# Patient Record
Sex: Female | Born: 1995 | Race: Black or African American | Hispanic: No | Marital: Single | Smoking: Never smoker
Health system: Southern US, Community
[De-identification: ages and names within clinical notes are randomized; demographics above are authoritative.]

## PROBLEM LIST (undated history)

## (undated) ENCOUNTER — Inpatient Hospital Stay (HOSPITAL_COMMUNITY): Payer: Self-pay

## (undated) DIAGNOSIS — Z789 Other specified health status: Secondary | ICD-10-CM

## (undated) DIAGNOSIS — D509 Iron deficiency anemia, unspecified: Secondary | ICD-10-CM

## (undated) HISTORY — PX: NO PAST SURGERIES: SHX2092

## (undated) HISTORY — DX: Iron deficiency anemia, unspecified: D50.9

---
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
## 2015-03-29 ENCOUNTER — Inpatient Hospital Stay (HOSPITAL_COMMUNITY)
Admission: AD | Admit: 2015-03-29 | Discharge: 2015-03-29 | Disposition: A | Discharge status: Home / Self Care | Payer: 59 | Source: Ambulatory Visit | Attending: Obstetrics & Gynecology | Admitting: Obstetrics & Gynecology

## 2015-03-29 ENCOUNTER — Encounter (HOSPITAL_COMMUNITY): Payer: Self-pay | Admitting: *Deleted

## 2015-03-29 DIAGNOSIS — Z3A01 Less than 8 weeks gestation of pregnancy: Secondary | ICD-10-CM | POA: Diagnosis not present

## 2015-03-29 DIAGNOSIS — O219 Vomiting of pregnancy, unspecified: Secondary | ICD-10-CM | POA: Diagnosis not present

## 2015-03-29 DIAGNOSIS — R42 Dizziness and giddiness: Secondary | ICD-10-CM | POA: Diagnosis not present

## 2015-03-29 DIAGNOSIS — O210 Mild hyperemesis gravidarum: Secondary | ICD-10-CM | POA: Insufficient documentation

## 2015-03-29 HISTORY — DX: Other specified health status: Z78.9

## 2015-03-29 LAB — POCT PREGNANCY, URINE: Preg Test, Ur: POSITIVE — AB

## 2015-03-29 LAB — URINALYSIS, ROUTINE W REFLEX MICROSCOPIC
BILIRUBIN URINE: NEGATIVE
Glucose, UA: NEGATIVE mg/dL
Ketones, ur: 15 mg/dL — AB
Leukocytes, UA: NEGATIVE
Nitrite: NEGATIVE
PH: 6 (ref 5.0–8.0)
Protein, ur: NEGATIVE mg/dL
Specific Gravity, Urine: 1.02 (ref 1.005–1.030)
Urobilinogen, UA: 0.2 mg/dL (ref 0.0–1.0)

## 2015-03-29 LAB — URINE MICROSCOPIC-ADD ON

## 2015-03-29 MED ORDER — PROMETHAZINE HCL 25 MG PO TABS: 25.0000 mg | ORAL_TABLET | Freq: Four times a day (QID) | ORAL | Status: DC | PRN

## 2015-03-29 MED ORDER — PROMETHAZINE HCL 25 MG PO TABS
25.0000 mg | ORAL_TABLET | Freq: Once | ORAL | Status: AC
  Administered 2015-03-29: 25 mg via ORAL
  Filled 2015-03-29: qty 1

## 2015-03-29 MED ORDER — CONCEPT OB 130-92.4-1 MG PO CAPS: 1.0000 | ORAL_CAPSULE | Freq: Every day | ORAL | Status: DC

## 2015-03-29 NOTE — MAU Note (Signed)
LMP 02/17/15. Took HPT and it was positive. Started to become dizzy yesterday. States she is nauseated and continues to vomit and is unable to hold down any food. Denies any bleeding or discharge. Made an appointment for the clinic on [address] for tomorrow.

## 2015-03-29 NOTE — Discharge Instructions (Signed)
Morning Sickness  Morning sickness is when you feel sick to your stomach (nauseous) during pregnancy. This nauseous feeling may or may not come with vomiting. It often occurs in the morning but can be a problem any time of day. Morning sickness is most common during the first trimester, but it may continue throughout pregnancy. While morning sickness is unpleasant, it is usually harmless unless you develop severe and continual vomiting (hyperemesis gravidarum). This condition requires more intense treatment.   CAUSES   The cause of morning sickness is not completely known but seems to be related to normal hormonal changes that occur in pregnancy.  RISK FACTORS  You are at greater risk if you:  · Experienced nausea or vomiting before your pregnancy.  · Had morning sickness during a previous pregnancy.  · Are pregnant with more than one baby, such as twins.  TREATMENT   Do not use any medicines (prescription, over-the-counter, or herbal) for morning sickness without first talking to your health care provider. Your health care provider may prescribe or recommend:  · Vitamin B6 supplements.  · Anti-nausea medicines.  · The herbal medicine ginger.  HOME CARE INSTRUCTIONS   · Only take over-the-counter or prescription medicines as directed by your health care provider.  · Taking multivitamins before getting pregnant can prevent or decrease the severity of morning sickness in most women.  · Eat a piece of dry toast or unsalted crackers before getting out of bed in the morning.  · Eat five or six small meals a day.  · Eat dry and bland foods (rice, baked potato). Foods high in carbohydrates are often helpful.  · Do not drink liquids with your meals. Drink liquids between meals.  · Avoid greasy, fatty, and spicy foods.  · Get someone to cook for you if the smell of any food causes nausea and vomiting.  · If you feel nauseous after taking prenatal vitamins, take the vitamins at night or with a snack.   · Snack on protein  foods (nuts, yogurt, cheese) between meals if you are hungry.  · Eat unsweetened gelatins for desserts.  · Wearing an acupressure wristband (worn for sea sickness) may be helpful.  · Acupuncture may be helpful.  · Do not smoke.  · Get a humidifier to keep the air in your house free of odors.  · Get plenty of fresh air.  SEEK MEDICAL CARE IF:   · Your home remedies are not working, and you need medicine.  · You feel dizzy or lightheaded.  · You are losing weight.  SEEK IMMEDIATE MEDICAL CARE IF:   · You have persistent and uncontrolled nausea and vomiting.  · You pass out (faint).  MAKE SURE YOU:  · Understand these instructions.  · Will watch your condition.  · Will get help right away if you are not doing well or get worse.  Document Released: 09/27/2006 Document Revised: 08/11/2013 Document Reviewed: 01/21/2013  ExitCare® Patient Information ©2015 [employer]. This information is not intended to replace advice given to you by your health care provider. Make sure you discuss any questions you have with your health care provider.    First Trimester of Pregnancy  The first trimester of pregnancy is from week 1 until the end of week 12 (months 1 through 3). A week after a sperm fertilizes an egg, the egg will implant on the wall of the uterus. This embryo will begin to develop into a baby. Genes from you and your partner are forming   the baby. The female genes determine whether the baby is a boy or a girl. At 6-8 weeks, the eyes and face are formed, and the heartbeat can be seen on ultrasound. At the end of 12 weeks, all the baby's organs are formed.   Now that you are pregnant, you will want to do everything you can to have a healthy baby. Two of the most important things are to get good prenatal care and to follow your health care provider's instructions. Prenatal care is all the medical care you receive before the baby's birth. This care will help prevent, find, and treat any problems during the pregnancy and  childbirth.  BODY CHANGES  Your body goes through many changes during pregnancy. The changes vary from woman to woman.   · You may gain or lose a couple of pounds at first.  · You may feel sick to your stomach (nauseous) and throw up (vomit). If the vomiting is uncontrollable, call your health care provider.  · You may tire easily.  · You may develop headaches that can be relieved by medicines approved by your health care provider.  · You may urinate more often. Painful urination may mean you have a bladder infection.  · You may develop heartburn as a result of your pregnancy.  · You may develop constipation because certain hormones are causing the muscles that push waste through your intestines to slow down.  · You may develop hemorrhoids or swollen, bulging veins (varicose veins).  · Your breasts may begin to grow larger and become tender. Your nipples may stick out more, and the tissue that surrounds them (areola) may become darker.  · Your gums may bleed and may be sensitive to brushing and flossing.  · Dark spots or blotches (chloasma, mask of pregnancy) may develop on your face. This will likely fade after the baby is born.  · Your menstrual periods will stop.  · You may have a loss of appetite.  · You may develop cravings for certain kinds of food.  · You may have changes in your emotions from day to day, such as being excited to be pregnant or being concerned that something may go wrong with the pregnancy and baby.  · You may have more vivid and strange dreams.  · You may have changes in your hair. These can include thickening of your hair, rapid growth, and changes in texture. Some women also have hair loss during or after pregnancy, or hair that feels dry or thin. Your hair will most likely return to normal after your baby is born.  WHAT TO EXPECT AT YOUR PRENATAL VISITS  During a routine prenatal visit:  · You will be weighed to make sure you and the baby are growing normally.  · Your blood pressure will  be taken.  · Your abdomen will be measured to track your baby's growth.  · The fetal heartbeat will be listened to starting around week 10 or 12 of your pregnancy.  · Test results from any previous visits will be discussed.  Your health care provider may ask you:  · How you are feeling.  · If you are feeling the baby move.  · If you have had any abnormal symptoms, such as leaking fluid, bleeding, severe headaches, or abdominal cramping.  · If you have any questions.  Other tests that may be performed during your first trimester include:  · Blood tests to find your blood type and to check for the presence   of any previous infections. They will also be used to check for low iron levels (anemia) and Rh antibodies. Later in the pregnancy, blood tests for diabetes will be done along with other tests if problems develop.  · Urine tests to check for infections, diabetes, or protein in the urine.  · An ultrasound to confirm the proper growth and development of the baby.  · An amniocentesis to check for possible genetic problems.  · Fetal screens for spina bifida and Down syndrome.  · You may need other tests to make sure you and the baby are doing well.  HOME CARE INSTRUCTIONS   Medicines  · Follow your health care provider's instructions regarding medicine use. Specific medicines may be either safe or unsafe to take during pregnancy.  · Take your prenatal vitamins as directed.  · If you develop constipation, try taking a stool softener if your health care provider approves.  Diet  · Eat regular, well-balanced meals. Choose a variety of foods, such as meat or vegetable-based protein, fish, milk and low-fat dairy products, vegetables, fruits, and whole grain breads and cereals. Your health care provider will help you determine the amount of weight gain that is right for you.  · Avoid raw meat and uncooked cheese. These carry germs that can cause birth defects in the baby.  · Eating four or five small meals rather than three  large meals a day may help relieve nausea and vomiting. If you start to feel nauseous, eating a few soda crackers can be helpful. Drinking liquids between meals instead of during meals also seems to help nausea and vomiting.  · If you develop constipation, eat more high-fiber foods, such as fresh vegetables or fruit and whole grains. Drink enough fluids to keep your urine clear or pale yellow.  Activity and Exercise  · Exercise only as directed by your health care provider. Exercising will help you:  ¨ Control your weight.  ¨ Stay in shape.  ¨ Be prepared for labor and delivery.  · Experiencing pain or cramping in the lower abdomen or low back is a good sign that you should stop exercising. Check with your health care provider before continuing normal exercises.  · Try to avoid standing for long periods of time. Move your legs often if you must stand in one place for a long time.  · Avoid heavy lifting.  · Wear low-heeled shoes, and practice good posture.  · You may continue to have sex unless your health care provider directs you otherwise.  Relief of Pain or Discomfort  · Wear a good support bra for breast tenderness.    · Take warm sitz baths to soothe any pain or discomfort caused by hemorrhoids. Use hemorrhoid cream if your health care provider approves.    · Rest with your legs elevated if you have leg cramps or low back pain.  · If you develop varicose veins in your legs, wear support hose. Elevate your feet for 15 minutes, 3-4 times a day. Limit salt in your diet.  Prenatal Care  · Schedule your prenatal visits by the twelfth week of pregnancy. They are usually scheduled monthly at first, then more often in the last 2 months before delivery.  · Write down your questions. Take them to your prenatal visits.  · Keep all your prenatal visits as directed by your health care provider.  Safety  · Wear your seat belt at all times when driving.  · Make a list of emergency phone numbers, including   numbers for family,  friends, the hospital, and police and fire departments.  General Tips  · Ask your health care provider for a referral to a local prenatal education class. Begin classes no later than at the beginning of month 6 of your pregnancy.  · Ask for help if you have counseling or nutritional needs during pregnancy. Your health care provider can offer advice or refer you to specialists for help with various needs.  · Do not use hot tubs, steam rooms, or saunas.  · Do not douche or use tampons or scented sanitary pads.  · Do not cross your legs for long periods of time.  · Avoid cat litter boxes and soil used by cats. These carry germs that can cause birth defects in the baby and possibly loss of the fetus by miscarriage or stillbirth.  · Avoid all smoking, herbs, alcohol, and medicines not prescribed by your health care provider. Chemicals in these affect the formation and growth of the baby.  · Schedule a dentist appointment. At home, brush your teeth with a soft toothbrush and be gentle when you floss.  SEEK MEDICAL CARE IF:   · You have dizziness.  · You have mild pelvic cramps, pelvic pressure, or nagging pain in the abdominal area.  · You have persistent nausea, vomiting, or diarrhea.  · You have a bad smelling vaginal discharge.  · You have pain with urination.  · You notice increased swelling in your face, hands, legs, or ankles.  SEEK IMMEDIATE MEDICAL CARE IF:   · You have a fever.  · You are leaking fluid from your vagina.  · You have spotting or bleeding from your vagina.  · You have severe abdominal cramping or pain.  · You have rapid weight gain or loss.  · You vomit blood or material that looks like coffee grounds.  · You are exposed to German measles and have never had them.  · You are exposed to fifth disease or chickenpox.  · You develop a severe headache.  · You have shortness of breath.  · You have any kind of trauma, such as from a fall or a car accident.  Document Released: 07/31/2001 Document Revised:  12/21/2013 Document Reviewed: 06/16/2013  ExitCare® Patient Information ©2015 [employer]. This information is not intended to replace advice given to you by your health care provider. Make sure you discuss any questions you have with your health care provider.

## 2015-03-29 NOTE — MAU Provider Note (Signed)
History     CSN: [id number]  Arrival date and time: 03/29/15 4098   First Provider Initiated Contact with Patient 03/29/15 2040      No chief complaint on file.  This is a 19 y.o. female at [redacted]w[redacted]d by LMP who presents with c/o nausea and vomiting since yesterday. States cannot keep down food or fluids.  Denies abdominal pain, bleeding or other problems. Has an appointment at [facility] tomorrow for new OB intake.   Emesis  This is a new problem. The current episode started yesterday. The problem occurs 2 to 4 times per day. The problem has been unchanged. There has been no fever. Associated symptoms include dizziness. Pertinent negatives include no abdominal pain, chills, diarrhea, fever or headaches. She has tried nothing for the symptoms.  RN Note: LMP 02/17/15. Took HPT and it was positive. Started to become dizzy yesterday. States she is nauseated and continues to vomit and is unable to hold down any food. Denies any bleeding or discharge. Made an appointment for the clinic on [address] for tomorrow.           OB History    Gravida Para Term Preterm AB TAB SAB Ectopic Multiple Living   1               Past Medical History  Diagnosis Date  . Medical history non-contributory     Past Surgical History  Procedure Laterality Date  . No past surgeries      History reviewed. No pertinent family history.  History  Substance Use Topics  . Smoking status: Never Smoker   . Smokeless tobacco: Not on file  . Alcohol Use: No    Allergies: No Known Allergies  Prescriptions prior to admission  Medication Sig Dispense Refill Last Dose  . cephALEXin (KEFLEX) 500 MG capsule Take 1 capsule (500 mg total) by mouth 2 (two) times daily. (Patient not taking: Reported on 03/29/2015) 14 capsule 0     Review of Systems  Constitutional: Negative for fever, chills and malaise/fatigue.  Eyes: Negative for blurred vision.  Gastrointestinal: Positive for nausea and vomiting. Negative for abdominal  pain, diarrhea and constipation.  Genitourinary: Negative for dysuria.  Neurological: Positive for dizziness and weakness. Negative for sensory change, speech change, focal weakness and headaches.  Other systems negative  Physical Exam   Blood pressure 110/77, pulse 84, resp. rate 18.  Physical Exam  Constitutional: She appears well-developed. No distress.   Filed Vitals:   03/29/15 2013 03/29/15 2014 03/29/15 2015 03/29/15 2017  BP: 107/63 115/82 107/61 110/77  Pulse: 74 91 71 84  Resp:       Results for orders placed or performed during the hospital encounter of 03/29/15 (from the past 24 hour(s))  Urinalysis, Routine w reflex microscopic (not at [facility])     Status: Abnormal   Collection Time: 03/29/15  8:00 PM  Result Value Ref Range   Color, Urine YELLOW YELLOW   APPearance CLEAR CLEAR   Specific Gravity, Urine 1.020 1.005 - 1.030   pH 6.0 5.0 - 8.0   Glucose, UA NEGATIVE NEGATIVE mg/dL   Hgb urine dipstick TRACE (A) NEGATIVE   Bilirubin Urine NEGATIVE NEGATIVE   Ketones, ur 15 (A) NEGATIVE mg/dL   Protein, ur NEGATIVE NEGATIVE mg/dL   Urobilinogen, UA 0.2 0.0 - 1.0 mg/dL   Nitrite NEGATIVE NEGATIVE   Leukocytes, UA NEGATIVE NEGATIVE  Urine microscopic-add on     Status: Abnormal   Collection Time: 03/29/15  8:00 PM  Result  Value Ref Range   Squamous Epithelial / LPF RARE RARE   WBC, UA 0-2 <3 WBC/hpf   RBC / HPF 3-6 <3 RBC/hpf   Bacteria, UA FEW (A) RARE  Pregnancy, urine POC     Status: Abnormal   Collection Time: 03/29/15  8:10 PM  Result Value Ref Range   Preg Test, Ur POSITIVE (A) NEGATIVE    MAU Course  Procedures UA, Phenergan, Orthostatic VS normal.  Nausea improved. Tolerating ginger ale.  MDM -Patient presents with nausea and vomiting of pregnancy. No dehydration. Responded well to PO Phenergan.  -Dizziness likely secondary to nausea, vomiting, poor PO intake over the past 24 hours. Resolved with PO hydration.  Assessment and Plan  A:  Pregnancy  at [redacted]w[redacted]d by LMP        1. Vomiting affecting pregnancy, antepartum   2. Dizziness    PLAN Discharge home in stable condition. Advance diet slowly. Rx Phenergan, PNV. Follow-up Information    Follow up with [facility] [place] On 03/30/2015.   Why:  Routine prenatal visit   Contact information:   [address] [place] [address] [phone]      Follow up with [facility] [place] [facility].   Why:  As needed in emergencies   Contact information:   [address] [id number] [place] [address] [phone]        Medication List    STOP taking these medications        cephALEXin 500 MG capsule  Commonly known as:  KEFLEX      TAKE these medications        CONCEPT OB 130-92.4-1 MG Caps  Take 1 tablet by mouth daily.     promethazine 25 MG tablet  Commonly known as:  PHENERGAN  Take 1 tablet (25 mg total) by mouth every 6 (six) hours as needed for nausea or vomiting.       [facility] 03/29/2015, 8:40 PM   Dorathy Kinsman, CNM 03/29/2015 9:15 PM

## 2015-03-29 NOTE — MAU Note (Signed)
Pt. States she is feeling a little better. Continues to drink ginger ale and has not had any vomiting since being here.

## 2015-03-30 ENCOUNTER — Encounter (HOSPITAL_COMMUNITY): Payer: Self-pay | Admitting: *Deleted

## 2015-03-30 ENCOUNTER — Inpatient Hospital Stay (HOSPITAL_COMMUNITY)
Admission: AD | Admit: 2015-03-30 | Discharge: 2015-03-30 | Disposition: A | Discharge status: Home / Self Care | Payer: 59 | Source: Ambulatory Visit | Attending: Obstetrics & Gynecology | Admitting: Obstetrics & Gynecology

## 2015-03-30 DIAGNOSIS — Z3A01 Less than 8 weeks gestation of pregnancy: Secondary | ICD-10-CM | POA: Insufficient documentation

## 2015-03-30 DIAGNOSIS — E860 Dehydration: Secondary | ICD-10-CM

## 2015-03-30 DIAGNOSIS — O211 Hyperemesis gravidarum with metabolic disturbance: Secondary | ICD-10-CM | POA: Insufficient documentation

## 2015-03-30 DIAGNOSIS — O219 Vomiting of pregnancy, unspecified: Secondary | ICD-10-CM | POA: Diagnosis not present

## 2015-03-30 LAB — URINE MICROSCOPIC-ADD ON

## 2015-03-30 LAB — URINALYSIS, ROUTINE W REFLEX MICROSCOPIC
Glucose, UA: NEGATIVE mg/dL
Ketones, ur: >80 mg/dL — AB
Nitrite: NEGATIVE
PROTEIN: 30 mg/dL — AB
Specific Gravity, Urine: 1.015 (ref 1.005–1.030)
UROBILINOGEN UA: 1 mg/dL (ref 0.0–1.0)
pH: 6 (ref 5.0–8.0)

## 2015-03-30 MED ORDER — PROMETHAZINE HCL 25 MG/ML IJ SOLN
25.0000 mg | INTRAMUSCULAR | Status: AC
  Administered 2015-03-30: 25 mg via INTRAVENOUS
  Filled 2015-03-30: qty 1

## 2015-03-30 MED ORDER — LACTATED RINGERS IV BOLUS (SEPSIS)
1000.0000 mL | Freq: Once | INTRAVENOUS | Status: AC
  Administered 2015-03-30: 1000 mL via INTRAVENOUS

## 2015-03-30 NOTE — Discharge Instructions (Signed)
Nausea medication to take during pregnancy:   Unisom (doxylamine succinate 25 mg tablets) Take one tablet daily at bedtime. If symptoms are not adequately controlled, the dose can be increased to a maximum recommended dose of two tablets daily (1/2 tablet in the morning, 1/2 tablet mid-afternoon and one at bedtime).  Vitamin B6  tablets. Take one tablet twice a day (up to 200 mg per day).  Dehydration, Adult Dehydration is when you lose more fluids from the body than you take in. Vital organs like the kidneys, brain, and heart cannot function without a proper amount of fluids and salt. Any loss of fluids from the body can cause dehydration.  CAUSES   Vomiting.  Diarrhea.  Excessive sweating.  Excessive urine output.  Fever. SYMPTOMS  Mild dehydration  Thirst.  Dry lips.  Slightly dry mouth. Moderate dehydration  Very dry mouth.  Sunken eyes.  Skin does not bounce back quickly when lightly pinched and released.  Dark urine and decreased urine production.  Decreased tear production.  Headache. Severe dehydration  Very dry mouth.  Extreme thirst.  Rapid, weak pulse (more than 100 beats per minute at rest).  Cold hands and feet.  Not able to sweat in spite of heat and temperature.  Rapid breathing.  Blue lips.  Confusion and lethargy.  Difficulty being awakened.  Minimal urine production.  No tears. DIAGNOSIS  Your caregiver will diagnose dehydration based on your symptoms and your exam. Blood and urine tests will help confirm the diagnosis. The diagnostic evaluation should also identify the cause of dehydration. TREATMENT  Treatment of mild or moderate dehydration can often be done at home by increasing the amount of fluids that you drink. It is best to drink small amounts of fluid more often. Drinking too much at one time can make vomiting worse. Refer to the home care instructions below. Severe dehydration needs to be treated at the hospital  where you will probably be given intravenous (IV) fluids that contain water and electrolytes. HOME CARE INSTRUCTIONS   Ask your caregiver about specific rehydration instructions.  Drink enough fluids to keep your urine clear or pale yellow.  Drink small amounts frequently if you have nausea and vomiting.  Eat as you normally do.  Avoid:  Foods or drinks high in sugar.  Carbonated drinks.  Juice.  Extremely hot or cold fluids.  Drinks with caffeine.  Fatty, greasy foods.  Alcohol.  Tobacco.  Overeating.  Gelatin desserts.  Wash your hands well to avoid spreading bacteria and viruses.  Only take over-the-counter or prescription medicines for pain, discomfort, or fever as directed by your caregiver.  Ask your caregiver if you should continue all prescribed and over-the-counter medicines.  Keep all follow-up appointments with your caregiver. SEEK MEDICAL CARE IF:  You have abdominal pain and it increases or stays in one area (localizes).  You have a rash, stiff neck, or severe headache.  You are irritable, sleepy, or difficult to awaken.  You are weak, dizzy, or extremely thirsty. SEEK IMMEDIATE MEDICAL CARE IF:   You are unable to keep fluids down or you get worse despite treatment.  You have frequent episodes of vomiting or diarrhea.  You have blood or green matter (bile) in your vomit.  You have blood in your stool or your stool looks black and tarry.  You have not urinated in 6 to 8 hours, or you have only urinated a small amount of very dark urine.  You have a fever.  You faint. MAKE  SURE YOU:   Understand these instructions.  Will watch your condition.  Will get help right away if you are not doing well or get worse. Document Released: 08/06/2005 Document Revised: 10/29/2011 Document Reviewed: 03/26/2011 [facility] Patient Information 2015 [place], [place]. This information is not intended to replace advice given to you by your health care  provider. Make sure you discuss any questions you have with your health care provider. Morning Sickness Morning sickness is when you feel sick to your stomach (nauseous) during pregnancy. This nauseous feeling may or may not come with vomiting. It often occurs in the morning but can be a problem any time of day. Morning sickness is most common during the first trimester, but it may continue throughout pregnancy. While morning sickness is unpleasant, it is usually harmless unless you develop severe and continual vomiting (hyperemesis gravidarum). This condition requires more intense treatment.  CAUSES  The cause of morning sickness is not completely known but seems to be related to normal hormonal changes that occur in pregnancy. RISK FACTORS You are at greater risk if you:  Experienced nausea or vomiting before your pregnancy.  Had morning sickness during a previous pregnancy.  Are pregnant with more than one baby, such as twins. TREATMENT  Do not use any medicines (prescription, over-the-counter, or herbal) for morning sickness without first talking to your health care provider. Your health care provider may prescribe or recommend:  Vitamin B6 supplements.  Anti-nausea medicines.  The herbal medicine ginger. HOME CARE INSTRUCTIONS   Only take over-the-counter or prescription medicines as directed by your health care provider.  Taking multivitamins before getting pregnant can prevent or decrease the severity of morning sickness in most women.  Eat a piece of dry toast or unsalted crackers before getting out of bed in the morning.  Eat five or six small meals a day.  Eat dry and bland foods (rice, baked potato). Foods high in carbohydrates are often helpful.  Do not drink liquids with your meals. Drink liquids between meals.  Avoid greasy, fatty, and spicy foods.  Get someone to cook for you if the smell of any food causes nausea and vomiting.  If you feel nauseous after taking  prenatal vitamins, take the vitamins at night or with a snack.  Snack on protein foods (nuts, yogurt, cheese) between meals if you are hungry.  Eat unsweetened gelatins for desserts.  Wearing an acupressure wristband (worn for sea sickness) may be helpful.  Acupuncture may be helpful.  Do not smoke.  Get a humidifier to keep the air in your house free of odors.  Get plenty of fresh air. SEEK MEDICAL CARE IF:   Your home remedies are not working, and you need medicine.  You feel dizzy or lightheaded.  You are losing weight. SEEK IMMEDIATE MEDICAL CARE IF:   You have persistent and uncontrolled nausea and vomiting.  You pass out (faint). MAKE SURE YOU:  Understand these instructions.  Will watch your condition.  Will get help right away if you are not doing well or get worse. Document Released: 09/27/2006 Document Revised: 08/11/2013 Document Reviewed: 01/21/2013 [facility] Patient Information 2015 [place], [place]. This information is not intended to replace advice given to you by your health care provider. Make sure you discuss any questions you have with your health care provider.

## 2015-03-30 NOTE — MAU Note (Signed)
Pt presents to MAU with complaints of morning sickness. States that she was evaluated here in MAU yesterday and given phenergan and it is not working. States that she feels worse. Denies any vaginal bleeding

## 2015-03-30 NOTE — MAU Provider Note (Signed)
History     CSN: [id number]  Arrival date and time: 03/30/15 1301   First Provider Initiated Contact with Patient 03/30/15 1331      Chief Complaint  Patient presents with  . Morning Sickness   HPI Rebecca Duran 19 y.o. G1P0 @[redacted]w[redacted]d  presents for nausea and vomiting in pregnancy.  For 2 days, she vomits 6-7 times each day.  She notes vomiting after each time she eats or drinks.  She reports abdominal pain related to vomiting only.  She denies vaginal bleeding.  She is not presently taking PNV.   OB History    Gravida Para Term Preterm AB TAB SAB Ectopic Multiple Living   1               Past Medical History  Diagnosis Date  . Medical history non-contributory     Past Surgical History  Procedure Laterality Date  . No past surgeries      History reviewed. No pertinent family history.  Social History  Substance Use Topics  . Smoking status: Never Smoker   . Smokeless tobacco: None  . Alcohol Use: No    Allergies: No Known Allergies  Prescriptions prior to admission  Medication Sig Dispense Refill Last Dose  . Prenat w/o A Vit-FeFum-FePo-FA (CONCEPT OB) 130-92.4-1 MG CAPS Take 1 tablet by mouth daily. 30 capsule 12   . promethazine (PHENERGAN) 25 MG tablet Take 1 tablet (25 mg total) by mouth every 6 (six) hours as needed for nausea or vomiting. 30 tablet 2     ROS Pertinent ROS in HPI.  All other systems are negative.   Physical Exam   Blood pressure 118/81, pulse 104, temperature 97.8 F (36.6 C), resp. rate 20, last menstrual period 02/17/2015.  Physical Exam  Constitutional: She is oriented to person, place, and time. She appears well-developed and well-nourished. No distress.  HENT:  Head: Normocephalic and atraumatic.  Eyes: EOM are normal.  Neck: Normal range of motion.  Cardiovascular: Normal rate.   Respiratory: No respiratory distress.  GI: Soft. There is no tenderness.  Musculoskeletal: Normal range of motion.  Neurological: She is alert and  oriented to person, place, and time.  Skin: Skin is warm and dry.  Psychiatric: She has a normal mood and affect.   Results for orders placed or performed during the hospital encounter of 03/30/15 (from the past 24 hour(s))  Urinalysis, Routine w reflex microscopic (not at [facility])     Status: Abnormal   Collection Time: 03/30/15  1:10 PM  Result Value Ref Range   Color, Urine YELLOW YELLOW   APPearance HAZY (A) CLEAR   Specific Gravity, Urine 1.015 1.005 - 1.030   pH 6.0 5.0 - 8.0   Glucose, UA NEGATIVE NEGATIVE mg/dL   Hgb urine dipstick TRACE (A) NEGATIVE   Bilirubin Urine SMALL (A) NEGATIVE   Ketones, ur >80 (A) NEGATIVE mg/dL   Protein, ur 30 (A) NEGATIVE mg/dL   Urobilinogen, UA 1.0 0.0 - 1.0 mg/dL   Nitrite NEGATIVE NEGATIVE   Leukocytes, UA TRACE (A) NEGATIVE  Urine microscopic-add on     Status: Abnormal   Collection Time: 03/30/15  1:10 PM  Result Value Ref Range   Squamous Epithelial / LPF FEW (A) RARE   WBC, UA 0-2 <3 WBC/hpf   RBC / HPF 0-2 <3 RBC/hpf   Urine-Other MUCOUS PRESENT     MAU Course  Procedures  MDM:  IVF to address dehydration IV phenergan to address nausea/vomiting.  Pt notes feeling improved  Assessment and Plan  A:  1. Nausea/vomiting in pregnancy   2. Dehydration    P: Discharge to home ALready has phenergan.   Nausea medication to take during pregnancy:   Unisom (doxylamine succinate 25 mg tablets) Take one tablet daily at bedtime. If symptoms are not adequately controlled, the dose can be increased to a maximum recommended dose of two tablets daily (1/2 tablet in the morning, 1/2 tablet mid-afternoon and one at bedtime).  Vitamin B6  tablets. Take one tablet twice a day (up to 200 mg per day). Obtain [facility] asap PNV qd Patient may return to MAU as needed or if her condition were to change or worsen   Glyn Ade, Scot Jun 03/30/2015, 1:32 PM

## 2015-04-02 ENCOUNTER — Encounter (HOSPITAL_COMMUNITY): Payer: Self-pay

## 2015-04-02 ENCOUNTER — Inpatient Hospital Stay (HOSPITAL_COMMUNITY)
Admission: AD | Admit: 2015-04-02 | Discharge: 2015-04-02 | Disposition: A | Discharge status: Home / Self Care | Payer: 59 | Source: Ambulatory Visit | Attending: Obstetrics & Gynecology | Admitting: Obstetrics & Gynecology

## 2015-04-02 DIAGNOSIS — O210 Mild hyperemesis gravidarum: Secondary | ICD-10-CM | POA: Diagnosis present

## 2015-04-02 DIAGNOSIS — Z3A01 Less than 8 weeks gestation of pregnancy: Secondary | ICD-10-CM | POA: Diagnosis not present

## 2015-04-02 DIAGNOSIS — R52 Pain, unspecified: Secondary | ICD-10-CM

## 2015-04-02 LAB — URINALYSIS, ROUTINE W REFLEX MICROSCOPIC
BILIRUBIN URINE: NEGATIVE
Glucose, UA: NEGATIVE mg/dL
Hgb urine dipstick: NEGATIVE
NITRITE: NEGATIVE
PH: 6.5 (ref 5.0–8.0)
PROTEIN: NEGATIVE mg/dL
SPECIFIC GRAVITY, URINE: 1.02 (ref 1.005–1.030)
Urobilinogen, UA: 0.2 mg/dL (ref 0.0–1.0)

## 2015-04-02 LAB — URINE MICROSCOPIC-ADD ON

## 2015-04-02 MED ORDER — PROMETHAZINE HCL 25 MG RE SUPP: 25.0000 mg | Freq: Four times a day (QID) | RECTAL | Status: DC | PRN

## 2015-04-02 MED ORDER — LACTATED RINGERS IV BOLUS (SEPSIS): 1000.0000 mL | Freq: Once | INTRAVENOUS | Status: DC

## 2015-04-02 MED ORDER — LACTATED RINGERS IV SOLN: Freq: Once | INTRAVENOUS | Status: DC

## 2015-04-02 MED ORDER — PROMETHAZINE HCL 25 MG RE SUPP
25.0000 mg | Freq: Once | RECTAL | Status: AC
Start: 2015-04-02 — End: 2015-04-02
  Administered 2015-04-02: 25 mg via RECTAL
  Filled 2015-04-02: qty 1

## 2015-04-02 NOTE — MAU Provider Note (Signed)
  History     CSN: [id number]  Arrival date and time: 04/02/15 2032   First Provider Initiated Contact with Patient 04/02/15 2132      Chief Complaint  Patient presents with  . Morning Sickness  . Dizziness   HPI  Rebecca Duran 19 y.o. G1P0 [redacted]w[redacted]d presents to the MAU stating that she is here for nausea and vomiting. States she is taking phenergan but is still throwing up. Denies abdominal pain or bleeding  Past Medical History  Diagnosis Date  . Medical history non-contributory     Past Surgical History  Procedure Laterality Date  . No past surgeries      History reviewed. No pertinent family history.  Social History  Substance Use Topics  . Smoking status: Never Smoker   . Smokeless tobacco: None  . Alcohol Use: No    Allergies: No Known Allergies  Prescriptions prior to admission  Medication Sig Dispense Refill Last Dose  . promethazine (PHENERGAN) 25 MG tablet Take 1 tablet (25 mg total) by mouth every 6 (six) hours as needed for nausea or vomiting. 30 tablet 2 04/02/2015 at Unknown time  . Prenat w/o A Vit-FeFum-FePo-FA (CONCEPT OB) 130-92.4-1 MG CAPS Take 1 tablet by mouth daily. (Patient not taking: Reported on 04/02/2015) 30 capsule 12     Review of Systems  Constitutional: Negative for fever.  Gastrointestinal: Positive for nausea and vomiting.  All other systems reviewed and are negative.  Physical Exam   Blood pressure 132/69, pulse 97, temperature 98.3 F (36.8 C), temperature source Oral, resp. rate 16, height  (1.626 m), weight 55.396 kg (122 lb 2 oz), last menstrual period 02/17/2015, SpO2 100 %.  Physical Exam  Nursing note and vitals reviewed. Constitutional: She is oriented to person, place, and time. She appears well-developed and well-nourished. No distress.  Neck: Normal range of motion.  Cardiovascular: Normal rate.   Respiratory: Effort normal. No respiratory distress.  GI: Soft. There is no tenderness.  Musculoskeletal: Normal  range of motion.  Neurological: She is alert and oriented to person, place, and time.  Skin: Skin is warm and dry.  Psychiatric: She has a normal mood and affect. Her behavior is normal. Judgment and thought content normal.    MAU Course  Procedures  MDM Will evaluate urine and give phenergan suppositiory  Assessment and Plan  Nausea and Vomiting in pregnancy  Discharge to home  [facility] 04/02/2015, 9:36 PM

## 2015-04-02 NOTE — Discharge Instructions (Signed)

## 2015-04-02 NOTE — MAU Note (Addendum)
Doesn't know how far along she is .  Still feeling dizzy.  Started having abd cramps on Wednesday. Taking phenergan for nausea not helping.  Sometimes vomit medicine back up.  Hard to keep food down.  Vomited 4 times.  No bleeding.  Has appt on [address] this Monday for first OB appt.

## 2015-05-16 ENCOUNTER — Other Ambulatory Visit (HOSPITAL_COMMUNITY): Payer: Self-pay | Admitting: Nurse Practitioner

## 2015-05-16 DIAGNOSIS — Z3682 Encounter for antenatal screening for nuchal translucency: Secondary | ICD-10-CM

## 2015-05-16 DIAGNOSIS — Z3A13 13 weeks gestation of pregnancy: Secondary | ICD-10-CM

## 2015-05-16 LAB — OB RESULTS CONSOLE ABO/RH: RH Type: POSITIVE

## 2015-05-16 LAB — OB RESULTS CONSOLE GC/CHLAMYDIA
CHLAMYDIA, DNA PROBE: NEGATIVE
GC PROBE AMP, GENITAL: NEGATIVE

## 2015-05-16 LAB — OB RESULTS CONSOLE HEPATITIS B SURFACE ANTIGEN: HEP B S AG: NEGATIVE

## 2015-05-16 LAB — OB RESULTS CONSOLE RPR: RPR: NONREACTIVE

## 2015-05-16 LAB — OB RESULTS CONSOLE ANTIBODY SCREEN: ANTIBODY SCREEN: NEGATIVE

## 2015-05-16 LAB — OB RESULTS CONSOLE RUBELLA ANTIBODY, IGM: Rubella: IMMUNE

## 2015-05-16 LAB — OB RESULTS CONSOLE HIV ANTIBODY (ROUTINE TESTING): HIV: NONREACTIVE

## 2015-05-24 ENCOUNTER — Other Ambulatory Visit (HOSPITAL_COMMUNITY): Payer: 59

## 2015-05-24 ENCOUNTER — Ambulatory Visit (HOSPITAL_COMMUNITY): Payer: 59 | Attending: Nurse Practitioner

## 2015-07-27 ENCOUNTER — Encounter (HOSPITAL_COMMUNITY): Payer: Self-pay | Admitting: *Deleted

## 2015-07-27 ENCOUNTER — Inpatient Hospital Stay (HOSPITAL_COMMUNITY)
Admission: AD | Admit: 2015-07-27 | Discharge: 2015-07-27 | Disposition: A | Discharge status: Home / Self Care | Payer: Self-pay | Source: Ambulatory Visit | Attending: Obstetrics and Gynecology | Admitting: Obstetrics and Gynecology

## 2015-07-27 DIAGNOSIS — Z3A22 22 weeks gestation of pregnancy: Secondary | ICD-10-CM | POA: Insufficient documentation

## 2015-07-27 DIAGNOSIS — A084 Viral intestinal infection, unspecified: Secondary | ICD-10-CM | POA: Insufficient documentation

## 2015-07-27 DIAGNOSIS — O98512 Other viral diseases complicating pregnancy, second trimester: Secondary | ICD-10-CM | POA: Insufficient documentation

## 2015-07-27 DIAGNOSIS — R197 Diarrhea, unspecified: Secondary | ICD-10-CM | POA: Insufficient documentation

## 2015-07-27 DIAGNOSIS — R51 Headache: Secondary | ICD-10-CM | POA: Insufficient documentation

## 2015-07-27 LAB — URINE MICROSCOPIC-ADD ON

## 2015-07-27 LAB — CBC
HCT: 33.1 % — ABNORMAL LOW (ref 36.0–46.0)
HEMOGLOBIN: 10.8 g/dL — AB (ref 12.0–15.0)
MCH: 29 pg (ref 26.0–34.0)
MCHC: 32.6 g/dL (ref 30.0–36.0)
MCV: 89 fL (ref 78.0–100.0)
PLATELETS: 302 10*3/uL (ref 150–400)
RBC: 3.72 MIL/uL — AB (ref 3.87–5.11)
RDW: 12.9 % (ref 11.5–15.5)
WBC: 13.7 10*3/uL — AB (ref 4.0–10.5)

## 2015-07-27 LAB — URINALYSIS, ROUTINE W REFLEX MICROSCOPIC
Bilirubin Urine: NEGATIVE
Glucose, UA: NEGATIVE mg/dL
Hgb urine dipstick: NEGATIVE
Ketones, ur: NEGATIVE mg/dL
Nitrite: NEGATIVE
PH: 6.5 (ref 5.0–8.0)
Protein, ur: NEGATIVE mg/dL
SPECIFIC GRAVITY, URINE: 1.02 (ref 1.005–1.030)

## 2015-07-27 MED ORDER — ONDANSETRON 8 MG PO TBDP: 8.0000 mg | ORAL_TABLET | Freq: Three times a day (TID) | ORAL | Status: DC | PRN

## 2015-07-27 MED ORDER — DEXTROSE 5 % IN LACTATED RINGERS IV BOLUS
1000.0000 mL | Freq: Once | INTRAVENOUS | Status: AC
  Administered 2015-07-27: 1000 mL via INTRAVENOUS

## 2015-07-27 MED ORDER — ACETAMINOPHEN 325 MG PO TABS
650.0000 mg | ORAL_TABLET | Freq: Once | ORAL | Status: AC
  Administered 2015-07-27: 650 mg via ORAL
  Filled 2015-07-27: qty 2

## 2015-07-27 MED ORDER — ONDANSETRON 8 MG PO TBDP
8.0000 mg | ORAL_TABLET | Freq: Once | ORAL | Status: AC
  Administered 2015-07-27: 8 mg via ORAL
  Filled 2015-07-27: qty 1

## 2015-07-27 NOTE — MAU Provider Note (Signed)
History     CSN: [id number]  Arrival date and time: 07/27/15 1541   First Provider Initiated Contact with Patient 07/27/15 1607      Chief Complaint  Patient presents with  . Diarrhea   HPI Pt is 2524w6d pregnant G1P0 who presents with nausea, headache and diarrhea. Pt has felt hot, but no known fever. Pt's headache is located over her forehead and right temporal area. She has light sensitivity. Pt has not taken anything for pain. Pt has been able to eat, but just has diarrhea.  Pt last ate 5  [place] and some chicken tenders. Pt denies spotting or bleeding. Pt works at [employer] on [address]- kids have been sick Charity fundraiserN note: Editor: Reita ChardLori R Paschal, RN (Designer, jewelleryegistered Nurse)     Expand All Collapse All   Pt states she has had 4 episodes of diarrhea in last two days. Denies Vomiting. Pt was able to tolerate french fries and chicken tenders prior to coming to MAU. Pt states she does have a headache but hasn't been running a fever. Denies vaginal bleeding or ROM. Reports good fetal movement.         Past Medical History  Diagnosis Date  . Medical history non-contributory     Past Surgical History  Procedure Laterality Date  . No past surgeries      History reviewed. No pertinent family history.  Social History  Substance Use Topics  . Smoking status: Never Smoker   . Smokeless tobacco: None  . Alcohol Use: No    Allergies: No Known Allergies  Prescriptions prior to admission  Medication Sig Dispense Refill Last Dose  . Prenat w/o A Vit-FeFum-FePo-FA (CONCEPT OB) 130-92.4-1 MG CAPS Take 1 tablet by mouth daily. 30 capsule 12 Past Week at Unknown time  . promethazine (PHENERGAN) 25 MG suppository Place 1 suppository (25 mg total) rectally every 6 (six) hours as needed for nausea. (Patient not taking: Reported on 07/27/2015) 24 suppository 1   . promethazine (PHENERGAN) 25 MG tablet Take 1 tablet (25 mg total) by mouth every 6 (six) hours as needed  for nausea or vomiting. (Patient not taking: Reported on 07/27/2015) 30 tablet 2 04/02/2015 at Unknown time    Review of Systems  Gastrointestinal: Positive for nausea, vomiting, abdominal pain and diarrhea.  Genitourinary: Negative for dysuria.  Neurological: Positive for headaches.   Physical Exam   Blood pressure 127/78, pulse 106, temperature 98.3 F (36.8 C), temperature source Oral, resp. rate 18, last menstrual period 02/17/2015, SpO2 100 %.  Physical Exam  Nursing note and vitals reviewed. Constitutional: She is oriented to person, place, and time. She appears well-developed and well-nourished. No distress.  HENT:  Head: Normocephalic.  Eyes: Pupils are equal, round, and reactive to light.  Neck: Normal range of motion. Neck supple.  Cardiovascular: Normal rate.   Respiratory: Effort normal.  GI: Soft.  Musculoskeletal: Normal range of motion.  Neurological: She is alert and oriented to person, place, and time.  Skin: Skin is warm and dry.  Psychiatric: She has a normal mood and affect.    MAU Course  Procedures Results for orders placed or performed during the hospital encounter of 07/27/15 (from the past 24 hour(s))  Urinalysis, Routine w reflex microscopic ([facility])     Status: Abnormal   Collection Time: 07/27/15  3:55 PM  Result Value Ref Range   Color, Urine YELLOW YELLOW   APPearance CLEAR CLEAR   Specific Gravity, Urine 1.020 1.005 -  1.030   pH 6.5 5.0 - 8.0   Glucose, UA NEGATIVE NEGATIVE mg/dL   Hgb urine dipstick NEGATIVE NEGATIVE   Bilirubin Urine NEGATIVE NEGATIVE   Ketones, ur NEGATIVE NEGATIVE mg/dL   Protein, ur NEGATIVE NEGATIVE mg/dL   Nitrite NEGATIVE NEGATIVE   Leukocytes, UA SMALL (A) NEGATIVE  Urine microscopic-add on     Status: Abnormal   Collection Time: 07/27/15  3:55 PM  Result Value Ref Range   Squamous Epithelial / LPF 6-30 (A) NONE SEEN   WBC, UA 6-30 0 - 5 WBC/hpf   RBC / HPF 0-5 0 - 5 RBC/hpf   Bacteria, UA FEW (A) NONE  SEEN  CBC     Status: Abnormal   Collection Time: 07/27/15  4:30 PM  Result Value Ref Range   WBC 13.7 (H) 4.0 - 10.5 K/uL   RBC 3.72 (L) 3.87 - 5.11 MIL/uL   Hemoglobin 10.8 (L) 12.0 - 15.0 g/dL   HCT 40.9 (L) 81.1 - 91.4 %   MCV 89.0 78.0 - 100.0 fL   MCH 29.0 26.0 - 34.0 pg   MCHC 32.6 30.0 - 36.0 g/dL   RDW 78.2 95.6 - 21.3 %   Platelets 302 150 - 400 K/uL  IV D5LR bolus Zofran 8 mg ODT Tylenol  PO Headache resolved No bouts of diarrhea while in MAU  Assessment and Plan  Viral gastroenteritis in pregnancy with h/a, nausea and diarrhea- BRAT diet Rx Zofran Viable IUP [redacted]w[redacted]d  [facility] Return sooner for worsening symptoms Note to be out of work today and tomorrow   LINEBERRY,SUSAN 07/27/2015, 4:19 PM

## 2015-07-27 NOTE — MAU Note (Signed)
Pt states she has had 4 episodes of diarrhea in last two days.  Denies Vomiting.  Pt was able to tolerate french fries and chicken tenders prior to coming to MAU.  Pt states she does have a headache but hasn't been running a fever.  Denies vaginal bleeding or ROM.  Reports good fetal movement.

## 2015-07-27 NOTE — Discharge Instructions (Signed)

## 2015-08-06 ENCOUNTER — Inpatient Hospital Stay (HOSPITAL_COMMUNITY)
Admission: AD | Admit: 2015-08-06 | Discharge: 2015-08-06 | Disposition: A | Discharge status: Home / Self Care | Payer: Self-pay | Source: Ambulatory Visit | Attending: Obstetrics & Gynecology | Admitting: Obstetrics & Gynecology

## 2015-08-06 ENCOUNTER — Encounter (HOSPITAL_COMMUNITY): Payer: Self-pay

## 2015-08-06 DIAGNOSIS — O98812 Other maternal infectious and parasitic diseases complicating pregnancy, second trimester: Secondary | ICD-10-CM | POA: Insufficient documentation

## 2015-08-06 DIAGNOSIS — B373 Candidiasis of vulva and vagina: Secondary | ICD-10-CM | POA: Insufficient documentation

## 2015-08-06 DIAGNOSIS — B3731 Acute candidiasis of vulva and vagina: Secondary | ICD-10-CM

## 2015-08-06 DIAGNOSIS — Z3A24 24 weeks gestation of pregnancy: Secondary | ICD-10-CM | POA: Insufficient documentation

## 2015-08-06 LAB — URINALYSIS, ROUTINE W REFLEX MICROSCOPIC
Bilirubin Urine: NEGATIVE
Glucose, UA: NEGATIVE mg/dL
HGB URINE DIPSTICK: NEGATIVE
Ketones, ur: NEGATIVE mg/dL
Nitrite: NEGATIVE
PH: 8.5 — AB (ref 5.0–8.0)
Protein, ur: NEGATIVE mg/dL
SPECIFIC GRAVITY, URINE: 1.015 (ref 1.005–1.030)

## 2015-08-06 LAB — WET PREP, GENITAL
Clue Cells Wet Prep HPF POC: NONE SEEN
Sperm: NONE SEEN
TRICH WET PREP: NONE SEEN

## 2015-08-06 LAB — URINE MICROSCOPIC-ADD ON

## 2015-08-06 MED ORDER — TERCONAZOLE 0.8 % VA CREA: 1.0000 | TOPICAL_CREAM | Freq: Every day | VAGINAL | Status: AC

## 2015-08-06 NOTE — MAU Provider Note (Signed)
History     CSN: [id number]  Arrival date and time: 08/06/15 1058   First Provider Initiated Contact with Patient 08/06/15 1247      Chief Complaint  Patient presents with  . Vaginal Itching   HPI Patient is 19 y.o. G1P0 [redacted]w[redacted]d here with complaints of vaginal itching for the past couple days. Dysuria associated after she itches. She reports itching is worse after sex. She denies complications in this pregnancy. She reports no new sexual partners and/or known exposures to STI. Last intercourse was 1 week ago.  +FM, denies LOF, VB, contractions, vaginal discharge.    OB History    Gravida Para Term Preterm AB TAB SAB Ectopic Multiple Living   1         0      Past Medical History  Diagnosis Date  . Medical history non-contributory     Past Surgical History  Procedure Laterality Date  . No past surgeries      Family History  Problem Relation Age of Onset  . Cancer Neg Hx   . Diabetes Neg Hx   . Hypertension Neg Hx     Social History  Substance Use Topics  . Smoking status: Never Smoker   . Smokeless tobacco: Never Used  . Alcohol Use: No    Allergies: No Known Allergies  Prescriptions prior to admission  Medication Sig Dispense Refill Last Dose  . Prenat w/o A Vit-FeFum-FePo-FA (CONCEPT OB) 130-92.4-1 MG CAPS Take 1 tablet by mouth daily. 30 capsule 12 Past Week at Unknown time  . ondansetron (ZOFRAN ODT) 8 MG disintegrating tablet Take 1 tablet (8 mg total) by mouth every 8 (eight) hours as needed for nausea or vomiting. (Patient not taking: Reported on 08/06/2015) 20 tablet 0   . promethazine (PHENERGAN) 25 MG suppository Place 1 suppository (25 mg total) rectally every 6 (six) hours as needed for nausea. (Patient not taking: Reported on 07/27/2015) 24 suppository 1   . promethazine (PHENERGAN) 25 MG tablet Take 1 tablet (25 mg total) by mouth every 6 (six) hours as needed for nausea or vomiting. (Patient not taking: Reported on 07/27/2015) 30 tablet 2 04/02/2015  at Unknown time    Review of Systems  Constitutional: Negative for fever and chills.  Eyes: Negative for blurred vision and double vision.  Respiratory: Negative for cough and shortness of breath.   Cardiovascular: Negative for chest pain and orthopnea.  Gastrointestinal: Negative for nausea and vomiting.  Genitourinary: Negative for dysuria, frequency and flank pain.  Musculoskeletal: Negative for myalgias.  Skin: Negative for rash.  Neurological: Negative for dizziness, tingling, weakness and headaches.  Endo/Heme/Allergies: Does not bruise/bleed easily.  Psychiatric/Behavioral: Negative for depression and suicidal ideas. The patient is not nervous/anxious.    Physical Exam   Blood pressure 116/66, pulse 89, temperature 97.4 F (36.3 C), temperature source Oral, resp. rate 18, height  (1.626 m), weight 138 lb (62.596 kg), last menstrual period 02/17/2015, SpO2 100 %.  Physical Exam  Nursing note and vitals reviewed. Constitutional: She is oriented to person, place, and time. She appears well-developed and well-nourished. No distress.  Pregnant female  HENT:  Head: Normocephalic and atraumatic.  Eyes: Conjunctivae are normal. No scleral icterus.  Neck: Normal range of motion. Neck supple.  Cardiovascular: Normal rate and intact distal pulses.   Respiratory: Effort normal. She exhibits no tenderness.  GI: Soft. There is no tenderness. There is no rebound and no guarding.  Gravid  Genitourinary: Vaginal discharge (thick white discharge) found.  Musculoskeletal: Normal range of motion. She exhibits no edema.  Neurological: She is alert and oriented to person, place, and time.  Skin: Skin is warm and dry. No rash noted.  Psychiatric: She has a normal mood and affect.    MAU Course  Procedures  MDM Wet prep- +yeast GC/CT- pending  NST- appropriate for gestational age  Assessment and Plan  Rebecca Duran is 19 y.o. G1P0 at 6577w2d presenting with vaginal itching, wet  prep with yeast consistent with vaginal candidal infection -Terazol cream rx sent to pharmacy -discussed that if too expensive she can try OTC monostat  Federico FlakeKimberly Niles Shervon Kerwin 08/06/2015, 12:47 PM

## 2015-08-06 NOTE — Discharge Instructions (Signed)

## 2015-08-06 NOTE — MAU Note (Signed)
Pt reports vaginal irritation and itching. Pt denies bleeding and leaking of fluid. Pt also reports some burning when she urinates after intercourse.

## 2015-08-07 LAB — URINE CULTURE
CULTURE: NO GROWTH
SPECIAL REQUESTS: NORMAL

## 2015-08-08 LAB — GC/CHLAMYDIA PROBE AMP (~~LOC~~) NOT AT ARMC
Chlamydia: NEGATIVE
NEISSERIA GONORRHEA: NEGATIVE

## 2015-08-21 NOTE — L&D Delivery Note (Signed)
Operative Delivery Note At 4:19 PM a viable female was delivered via vacuum assisted vaginal delivery for repetative variable decelerations and maternal fatigue after 2 hours pushing.  Presentation: vertex; Position: Occiput,, Anterior.  Verbal consent: obtained from patient.  Risks and benefits discussed in detail.  Risks include, but are not limited to the risks of anesthesia, bleeding, infection, damage to maternal tissues, fetal cephalhematoma.  There is also the risk of inability to effect vaginal delivery of the head, or shoulder dystocia that cannot be resolved by established maneuvers, leading to the need for emergency cesarean section.  After 2 hours pushing, baby OA, +2-3 station.  Vacuum applied and increased to 500mmHg with each contraction and discontinued at the end of each contraction.  1 pop-off.  Baby delivered with 4 contractions and 4 pulls.    APGAR: 8, 9; weight pending.   Placenta status: delivered intact.   Cord: 3vc with the following complications: none.  Cord pH: n/a  Anesthesia: Epidural  Instruments: correct Lacerations:  2nd degree perineal Suture Repair: 3.0 vicryl rapide Est. Blood Loss (mL): 250 mL    Mom to postpartum.  Baby to Couplet care / Skin to Skin.  Candelaria CelesteSTINSON, Jozlynn Plaia JEHIEL 11/21/2015, 4:33 PM

## 2015-10-25 LAB — OB RESULTS CONSOLE GC/CHLAMYDIA
CHLAMYDIA, DNA PROBE: NEGATIVE
GC PROBE AMP, GENITAL: NEGATIVE

## 2015-10-25 LAB — OB RESULTS CONSOLE GBS: GBS: NEGATIVE

## 2015-11-20 ENCOUNTER — Encounter (HOSPITAL_COMMUNITY): Payer: Self-pay | Admitting: *Deleted

## 2015-11-20 ENCOUNTER — Inpatient Hospital Stay (HOSPITAL_COMMUNITY)
Admission: AD | Admit: 2015-11-20 | Discharge: 2015-11-23 | DRG: 774 | Disposition: A | Discharge status: Home / Self Care | Payer: Medicaid Other | Source: Ambulatory Visit | Attending: Family Medicine | Admitting: Family Medicine

## 2015-11-20 ENCOUNTER — Inpatient Hospital Stay (HOSPITAL_COMMUNITY)
Admission: AD | Admit: 2015-11-20 | Discharge: 2015-11-20 | Disposition: A | Discharge status: Home / Self Care | Payer: Medicaid Other | Source: Ambulatory Visit | Attending: Obstetrics and Gynecology | Admitting: Obstetrics and Gynecology

## 2015-11-20 DIAGNOSIS — Z8759 Personal history of other complications of pregnancy, childbirth and the puerperium: Secondary | ICD-10-CM

## 2015-11-20 DIAGNOSIS — Z3A39 39 weeks gestation of pregnancy: Secondary | ICD-10-CM

## 2015-11-20 DIAGNOSIS — O864 Pyrexia of unknown origin following delivery: Secondary | ICD-10-CM | POA: Diagnosis not present

## 2015-11-20 NOTE — Discharge Instructions (Signed)

## 2015-11-20 NOTE — MAU Note (Addendum)
Patient presents at [redacted] weeks gestation with c/o contractions every 2 minutes since 2300 last night. Fetus active. Denies leakage but states she has seen bloody show. Patient states she was checked Friday in the clinic and was told she was 3cms dilated.

## 2015-11-21 ENCOUNTER — Inpatient Hospital Stay (HOSPITAL_COMMUNITY): Payer: Medicaid Other | Admitting: Anesthesiology

## 2015-11-21 ENCOUNTER — Encounter (HOSPITAL_COMMUNITY): Payer: Self-pay

## 2015-11-21 DIAGNOSIS — Z3403 Encounter for supervision of normal first pregnancy, third trimester: Secondary | ICD-10-CM | POA: Diagnosis present

## 2015-11-21 DIAGNOSIS — Z3A39 39 weeks gestation of pregnancy: Secondary | ICD-10-CM

## 2015-11-21 DIAGNOSIS — O864 Pyrexia of unknown origin following delivery: Secondary | ICD-10-CM

## 2015-11-21 LAB — CBC
HCT: 30 % — ABNORMAL LOW (ref 36.0–46.0)
HEMOGLOBIN: 9.5 g/dL — AB (ref 12.0–15.0)
MCH: 25.9 pg — AB (ref 26.0–34.0)
MCHC: 31.7 g/dL (ref 30.0–36.0)
MCV: 81.7 fL (ref 78.0–100.0)
Platelets: 337 10*3/uL (ref 150–400)
RBC: 3.67 MIL/uL — AB (ref 3.87–5.11)
RDW: 14.1 % (ref 11.5–15.5)
WBC: 23.9 10*3/uL — ABNORMAL HIGH (ref 4.0–10.5)

## 2015-11-21 LAB — TYPE AND SCREEN
ABO/RH(D): A POS
Antibody Screen: NEGATIVE

## 2015-11-21 LAB — ABO/RH: ABO/RH(D): A POS

## 2015-11-21 MED ORDER — LIDOCAINE HCL (PF) 1 % IJ SOLN
30.0000 mL | INTRAMUSCULAR | Status: DC | PRN
  Administered 2015-11-21: 30 mL via SUBCUTANEOUS
  Filled 2015-11-21: qty 30

## 2015-11-21 MED ORDER — BENZOCAINE-MENTHOL 20-0.5 % EX AERO
1.0000 "application " | INHALATION_SPRAY | CUTANEOUS | Status: DC | PRN
  Administered 2015-11-21 (×2): 1 via TOPICAL
  Filled 2015-11-21 (×3): qty 56

## 2015-11-21 MED ORDER — CITRIC ACID-SODIUM CITRATE 334-500 MG/5ML PO SOLN: 30.0000 mL | ORAL | Status: DC | PRN

## 2015-11-21 MED ORDER — WITCH HAZEL-GLYCERIN EX PADS: 1.0000 "application " | MEDICATED_PAD | CUTANEOUS | Status: DC | PRN

## 2015-11-21 MED ORDER — DIBUCAINE 1 % RE OINT
1.0000 "application " | TOPICAL_OINTMENT | RECTAL | Status: DC | PRN
  Filled 2015-11-21: qty 28

## 2015-11-21 MED ORDER — ONDANSETRON HCL 4 MG PO TABS: 4.0000 mg | ORAL_TABLET | ORAL | Status: DC | PRN

## 2015-11-21 MED ORDER — ONDANSETRON HCL 4 MG/2ML IJ SOLN: 4.0000 mg | INTRAMUSCULAR | Status: DC | PRN

## 2015-11-21 MED ORDER — ONDANSETRON HCL 4 MG/2ML IJ SOLN
4.0000 mg | Freq: Four times a day (QID) | INTRAMUSCULAR | Status: DC | PRN
  Administered 2015-11-21: 4 mg via INTRAVENOUS
  Filled 2015-11-21: qty 2

## 2015-11-21 MED ORDER — LACTATED RINGERS IV SOLN: 500.0000 mL | INTRAVENOUS | Status: DC | PRN

## 2015-11-21 MED ORDER — OXYTOCIN 10 UNIT/ML IJ SOLN
2.5000 [IU]/h | INTRAMUSCULAR | Status: DC
  Administered 2015-11-21: 16:00:00 via INTRAVENOUS
  Filled 2015-11-21: qty 4

## 2015-11-21 MED ORDER — ACETAMINOPHEN 325 MG PO TABS: 650.0000 mg | ORAL_TABLET | ORAL | Status: DC | PRN

## 2015-11-21 MED ORDER — ACETAMINOPHEN 325 MG PO TABS
650.0000 mg | ORAL_TABLET | ORAL | Status: DC | PRN
  Administered 2015-11-21 – 2015-11-22 (×4): 650 mg via ORAL
  Filled 2015-11-21 (×5): qty 2

## 2015-11-21 MED ORDER — OXYTOCIN BOLUS FROM INFUSION: 500.0000 mL | INTRAVENOUS | Status: DC

## 2015-11-21 MED ORDER — LACTATED RINGERS IV SOLN
INTRAVENOUS | Status: DC
  Administered 2015-11-21: 125 mL/h via INTRAVENOUS
  Administered 2015-11-21: 04:00:00 via INTRAVENOUS

## 2015-11-21 MED ORDER — PRENATAL MULTIVITAMIN CH
1.0000 | ORAL_TABLET | Freq: Every day | ORAL | Status: DC
  Administered 2015-11-22 – 2015-11-23 (×2): 1 via ORAL
  Filled 2015-11-21 (×2): qty 1

## 2015-11-21 MED ORDER — SIMETHICONE 80 MG PO CHEW: 80.0000 mg | CHEWABLE_TABLET | ORAL | Status: DC | PRN

## 2015-11-21 MED ORDER — TETANUS-DIPHTH-ACELL PERTUSSIS 5-2.5-18.5 LF-MCG/0.5 IM SUSP
0.5000 mL | Freq: Once | INTRAMUSCULAR | Status: DC
  Filled 2015-11-21: qty 0.5

## 2015-11-21 MED ORDER — FENTANYL 2.5 MCG/ML BUPIVACAINE 1/10 % EPIDURAL INFUSION (WH - ANES)
14.0000 mL/h | INTRAMUSCULAR | Status: DC | PRN
  Administered 2015-11-21 (×2): 14 mL/h via EPIDURAL
  Filled 2015-11-21 (×2): qty 125

## 2015-11-21 MED ORDER — IBUPROFEN 600 MG PO TABS
600.0000 mg | ORAL_TABLET | Freq: Four times a day (QID) | ORAL | Status: DC
  Administered 2015-11-21 – 2015-11-23 (×8): 600 mg via ORAL
  Filled 2015-11-21 (×8): qty 1

## 2015-11-21 MED ORDER — PHENYLEPHRINE 40 MCG/ML (10ML) SYRINGE FOR IV PUSH (FOR BLOOD PRESSURE SUPPORT)
80.0000 ug | PREFILLED_SYRINGE | INTRAVENOUS | Status: DC | PRN
  Filled 2015-11-21: qty 20

## 2015-11-21 MED ORDER — ZOLPIDEM TARTRATE 5 MG PO TABS: 5.0000 mg | ORAL_TABLET | Freq: Every evening | ORAL | Status: DC | PRN

## 2015-11-21 MED ORDER — OXYCODONE-ACETAMINOPHEN 5-325 MG PO TABS: 1.0000 | ORAL_TABLET | ORAL | Status: DC | PRN

## 2015-11-21 MED ORDER — LANOLIN HYDROUS EX OINT: TOPICAL_OINTMENT | CUTANEOUS | Status: DC | PRN

## 2015-11-21 MED ORDER — EPHEDRINE 5 MG/ML INJ: 10.0000 mg | INTRAVENOUS | Status: DC | PRN

## 2015-11-21 MED ORDER — PHENYLEPHRINE 40 MCG/ML (10ML) SYRINGE FOR IV PUSH (FOR BLOOD PRESSURE SUPPORT): 80.0000 ug | PREFILLED_SYRINGE | INTRAVENOUS | Status: DC | PRN

## 2015-11-21 MED ORDER — DIPHENHYDRAMINE HCL 50 MG/ML IJ SOLN: 12.5000 mg | INTRAMUSCULAR | Status: DC | PRN

## 2015-11-21 MED ORDER — FLEET ENEMA 7-19 GM/118ML RE ENEM: 1.0000 | ENEMA | RECTAL | Status: DC | PRN

## 2015-11-21 MED ORDER — SENNOSIDES-DOCUSATE SODIUM 8.6-50 MG PO TABS
2.0000 | ORAL_TABLET | ORAL | Status: DC
  Administered 2015-11-21 – 2015-11-22 (×2): 2 via ORAL
  Filled 2015-11-21 (×2): qty 2

## 2015-11-21 MED ORDER — OXYCODONE-ACETAMINOPHEN 5-325 MG PO TABS: 2.0000 | ORAL_TABLET | ORAL | Status: DC | PRN

## 2015-11-21 MED ORDER — LIDOCAINE HCL (PF) 1 % IJ SOLN
INTRAMUSCULAR | Status: DC | PRN
  Administered 2015-11-21 (×2): 5 mL

## 2015-11-21 MED ORDER — FENTANYL CITRATE (PF) 100 MCG/2ML IJ SOLN: 100.0000 ug | INTRAMUSCULAR | Status: DC | PRN

## 2015-11-21 MED ORDER — MEASLES, MUMPS & RUBELLA VAC ~~LOC~~ INJ
0.5000 mL | INJECTION | Freq: Once | SUBCUTANEOUS | Status: DC
  Filled 2015-11-21: qty 0.5

## 2015-11-21 MED ORDER — LACTATED RINGERS IV SOLN
500.0000 mL | Freq: Once | INTRAVENOUS | Status: AC
  Administered 2015-11-21: 500 mL via INTRAVENOUS

## 2015-11-21 MED ORDER — DIPHENHYDRAMINE HCL 25 MG PO CAPS: 25.0000 mg | ORAL_CAPSULE | Freq: Four times a day (QID) | ORAL | Status: DC | PRN

## 2015-11-21 NOTE — Progress Notes (Signed)
Called to room 166 for delivery, vacuum. There for 30 mins before baby was born, and after birth team was sent away.

## 2015-11-21 NOTE — Anesthesia Preprocedure Evaluation (Signed)

## 2015-11-21 NOTE — Progress Notes (Signed)
LABOR PROGRESS NOTE  Rebecca Duran is a 20 y.o. G1P0 at 8025w4d  admitted for onset of labor  Subjective: Patient reports that pain is moderately controlled by epidural.  She is feeling a little more pressure now.  Water has broken.  Objective: BP 134/80 mmHg  Pulse 106  Temp(Src) 98.2 F (36.8 C) (Oral)  Resp 16  Ht 5' 4.5" (1.638 m)  Wt 169 lb (76.658 kg)  BMI 28.57 kg/m2  SpO2 99%  LMP 02/17/2015 or  Filed Vitals:   11/21/15 0354 11/21/15 0355 11/21/15 0400 11/21/15 0430  BP: 127/77  128/82 134/80  Pulse: 104 107 100 106  Temp:      TempSrc:      Resp: 16     Height:      Weight:      SpO2:        Fetal monitoringBaseline: 135 bpm, Variability: Good {> 6 bpm) and Accelerations: Reactive Uterine activityFrequency: Every 2-3.5 minutes   Dilation: 4 Effacement (%): 90 Cervical Position: Posterior Station: -3, -2 Presentation: Vertex Exam by:: D Jasso, RN  Labs: Lab Results  Component Value Date   WBC 23.9* 11/21/2015   HGB 9.5* 11/21/2015   HCT 30.0* 11/21/2015   MCV 81.7 11/21/2015   PLT 337 11/21/2015    Patient Active Problem List   Diagnosis Date Noted  . Indication for care in labor or delivery 11/21/2015    Assessment / Plan: 20 y.o. G1P0 at 3425w4d here for onset of labor. SROM @ 0530a  Labor: expectant management Fetal Wellbeing:  Cat 1 Pain Control:  Epidural Anticipated MOD:  SVD  Delynn FlavinAshly Akima Slaugh, DO PGY-2, [facility] 11/21/2015, 5:43 AM

## 2015-11-21 NOTE — Anesthesia Procedure Notes (Signed)
Epidural Patient location during procedure: OB  Staffing Anesthesiologist: Rebecca Duran, Rebecca Duran Performed by: anesthesiologist   Preanesthetic Checklist Completed: patient identified, site marked, surgical consent, pre-op evaluation, timeout performed, IV checked, risks and benefits discussed and monitors and equipment checked  Epidural Patient position: sitting Prep: DuraPrep Patient monitoring: heart rate, continuous pulse ox and blood pressure Approach: right paramedian Location: L2-L3 Injection technique: LOR saline  Needle:  Needle type: Tuohy  Needle gauge: 17 G Needle length: 9 cm and 9 Needle insertion depth: 6 cm Catheter type: closed end flexible Catheter size: 20 Guage Catheter at skin depth: 10 cm Test dose: negative  Assessment Events: blood not aspirated, injection not painful, no injection resistance, negative IV test and no paresthesia  Additional Notes Patient identified. Risks/Benefits/Options discussed with patient including but not limited to bleeding, infection, nerve damage, paralysis, failed block, incomplete pain control, headache, blood pressure changes, nausea, vomiting, reactions to medication both or allergic, itching and postpartum back pain. Confirmed with bedside nurse the patient's most recent platelet count. Confirmed with patient that they are not currently taking any anticoagulation, have any bleeding history or any family history of bleeding disorders. Patient expressed understanding and wished to proceed. All questions were answered. Sterile technique was used throughout the entire procedure. Please see nursing notes for vital signs. Test dose was given through epidural needle and negative prior to continuing to dose epidural or start infusion. Warning signs of high block given to the patient including shortness of breath, tingling/numbness in hands, complete motor block, or any concerning symptoms with instructions to call for help. Patient was given  instructions on fall risk and not to get out of bed. All questions and concerns addressed with instructions to call with any issues.

## 2015-11-21 NOTE — MAU Note (Signed)
Pt here with c/o contractions all day, worsening around 8:30PM. Was checked earlier today and was 3cm. GBS negative per patient. Reports positive fetal movement. Denies any problems with the pregnancy.

## 2015-11-21 NOTE — Consults (Signed)
  Anesthesia Pain Consult Note  Patient: Larwance RoteJanaya Pertuit, 20 y.o., female  Consult Requested by: Catalina AntiguaPeggy Constant, MD  Reason for Consult: CRNA Pain Management Pain 10 Epidural planned. Recent admit from MAU

## 2015-11-21 NOTE — Progress Notes (Signed)
May ambulate for an hour if patient desires then recheck cervix

## 2015-11-21 NOTE — Progress Notes (Signed)
Orders to admit

## 2015-11-21 NOTE — Progress Notes (Signed)
LABOR PROGRESS NOTE  Rebecca Duran is a 20 y.o. G1P0 at 7010w4d  admitted for onset of labor  Subjective: Patient reports that pain is well controlled by epidural.  Voices no concerns at this time.  Objective: BP 147/77 mmHg  Pulse 116  Temp(Src) 98.2 F (36.8 C) (Oral)  Resp 20  Ht 5' 4.5" (1.638 m)  Wt 169 lb (76.658 kg)  BMI 28.57 kg/m2  SpO2 100%  LMP 02/17/2015 or  Filed Vitals:   11/20/15 2351 11/21/15 0003 11/21/15 0308  BP:  129/77 147/77  Pulse:  106 116  Temp:  98.7 F (37.1 C) 98.2 F (36.8 C)  TempSrc:  Oral Oral  Resp:  20 20  Height: 5' 4.5" (1.638 m)    Weight: 169 lb (76.658 kg)    SpO2:  100%     Fetal monitoringBaseline: 130 bpm, Variability: Good {> 6 bpm) and Accelerations: Reactive Uterine activityFrequency: Every 3-5 minutes   Dilation: 4 Effacement (%): 80 Station: -2 Presentation: Vertex Exam by:: Ciscomber Stovall rN  Labs: Lab Results  Component Value Date   WBC 23.9* 11/21/2015   HGB 9.5* 11/21/2015   HCT 30.0* 11/21/2015   MCV 81.7 11/21/2015   PLT 337 11/21/2015    Patient Active Problem List   Diagnosis Date Noted  . Indication for care in labor or delivery 11/21/2015    Assessment / Plan: 20 y.o. G1P0 at 1010w4d here for onset of labor  Labor: expectant management Fetal Wellbeing:  Cat 1 Pain Control:  Epidural Anticipated MOD:  SVD  Erlin Gardella, DO 11/21/2015, 3:41 AM

## 2015-11-21 NOTE — H&P (Signed)
LABOR ADMISSION HISTORY AND PHYSICAL  Rebecca Duran is a 20 y.o. female G1P0 with IUP at [redacted]w[redacted]d by LMP presenting for onset of labor. She reports +FMs.  Denies LOF, VB, blurry vision, headaches, peripheral edema, RUQ pain.  She plans on breast feeding. She is undecided (shot/pill) for birth control.  She is undecided for pediatric care after discharge.  Dating: By LMP --->  Estimated Date of Delivery: 11/24/15  Prenatal History/Complications:  Past Medical History: Past Medical History  Diagnosis Date  . Medical history non-contributory     Past Surgical History: Past Surgical History  Procedure Laterality Date  . No past surgeries      Obstetrical History: OB History    Gravida Para Term Preterm AB TAB SAB Ectopic Multiple Living   1         0      Social History: Social History   Social History  . Marital Status: Single    Spouse Name: N/A  . Number of Children: N/A  . Years of Education: N/A   Social History Main Topics  . Smoking status: Never Smoker   . Smokeless tobacco: Never Used  . Alcohol Use: No  . Drug Use: No  . Sexual Activity: Yes    Birth Control/ Protection: None   Other Topics Concern  . None   Social History Narrative    Family History: Family History  Problem Relation Age of Onset  . Cancer Neg Hx   . Diabetes Neg Hx   . Hypertension Neg Hx     Allergies: No Known Allergies  Prescriptions prior to admission  Medication Sig Dispense Refill Last Dose  . ondansetron (ZOFRAN ODT) 8 MG disintegrating tablet Take 1 tablet (8 mg total) by mouth every 8 (eight) hours as needed for nausea or vomiting. (Patient not taking: Reported on 08/06/2015) 20 tablet 0 Not Taking at Unknown time  . Prenat w/o A Vit-FeFum-FePo-FA (CONCEPT OB) 130-92.4-1 MG CAPS Take 1 tablet by mouth daily. 30 capsule 12 11/19/2015 at Unknown time  . promethazine (PHENERGAN) 25 MG suppository Place 1 suppository (25 mg total) rectally every 6 (six) hours as needed for  nausea. (Patient not taking: Reported on 07/27/2015) 24 suppository 1 Not Taking at Unknown time  . promethazine (PHENERGAN) 25 MG tablet Take 1 tablet (25 mg total) by mouth every 6 (six) hours as needed for nausea or vomiting. (Patient not taking: Reported on 07/27/2015) 30 tablet 2 Not Taking at Unknown time     Review of Systems   All systems reviewed and negative except as stated in HPI  Blood pressure 129/77, pulse 106, temperature 98.7 F (37.1 C), temperature source Oral, resp. rate 20, height 5' 4.5" (1.638 m), weight 169 lb (76.658 kg), last menstrual period 02/17/2015, SpO2 100 %. General appearance: alert, cooperative, appears stated age and uncomfortable Lungs: clear to auscultation bilaterally, no increased WOB Heart: regular rate and rhythm, no m/r/g Abdomen: soft, non-tender; bowel sounds normal Pelvic: Dilation: 4 Effacement (%): 80 Station: -2 Presentation: Vertex Exam by:: Cisco rN Extremities: WWP, Homans sign is negative, no sign of DVT, +2 DP Neuro: no focal neurologic deficits Presentation: cephalic Fetal monitoringBaseline: 130 bpm, Variability: Good {> 6 bpm) and Accelerations: Reactive Uterine activityFrequency: Every 3-5 minutes Dilation: 4 Effacement (%): 80 Station: -2 Exam by:: Cisco rN  Prenatal labs: ABO, Rh: A/Positive/-- (09/26 0000) Antibody: Negative (09/26 0000) Rubella: !Error! IMMUNE RPR: Nonreactive (09/26 0000)  HBsAg: Negative (09/26 0000)  HIV: Non-reactive (09/26 0000)  GBS: Negative (03/07 0000)  1 hr Glucola 165, 3h GTT normal. Genetic screening  Neg QUAD Anatomy [facility].  No comment on abnormalities.  Mother reports female infant.  Prenatal Transfer Tool  Maternal Diabetes: No Genetic Screening: Normal Maternal Ultrasounds/Referrals: Normal Fetal Ultrasounds or other Referrals:  None Maternal Substance Abuse:  Yes:  Type: Marijuana 04/26/15 on UDS. Subsequent tests negative. Significant Maternal  Medications:  None Significant Maternal Lab Results: None  No results found for this or any previous visit (from the past 24 hour(s)).  Patient Active Problem List   Diagnosis Date Noted  . Indication for care in labor or delivery 11/21/2015    Assessment: Rebecca Duran is a 20 y.o. G1P0 at 7213w4d here for onset of labor  #Labor: progressing naturally #Pain: Wants epidural, Fentanyl IV also available until this is placed #FWB: Cat 1 #ID:  GBS neg #MOF: Breast #MOC:Depo/OCP #Circ:  Wants inpatient circ. BC/BS  Delynn FlavinAshly Gottschalk, DO PGY-2, [facility] 11/21/2015, 1:46 AM

## 2015-11-21 NOTE — Progress Notes (Signed)
Called Dr. Nancy MarusMayo regarding temperature of 100.5. Dr. Nancy MarusMayo said to monitor.

## 2015-11-22 LAB — RPR: RPR Ser Ql: NONREACTIVE

## 2015-11-22 MED ORDER — OXYCODONE HCL 5 MG PO TABS
5.0000 mg | ORAL_TABLET | ORAL | Status: DC | PRN
  Administered 2015-11-22 – 2015-11-23 (×3): 5 mg via ORAL
  Filled 2015-11-22 (×3): qty 1

## 2015-11-22 NOTE — Anesthesia Postprocedure Evaluation (Signed)
Anesthesia Post Note  Patient: Rebecca RoteJanaya Duran  Procedure(s) Performed: * No procedures listed *  Patient location during evaluation: Mother Baby Anesthesia Type: Epidural Level of consciousness: awake, awake and alert and oriented Pain management: pain level controlled Vital Signs Assessment: post-procedure vital signs reviewed and stable Respiratory status: spontaneous breathing, nonlabored ventilation and respiratory function stable Cardiovascular status: stable Postop Assessment: no headache, no backache, patient able to bend at knees, no signs of nausea or vomiting and epidural receding Anesthetic complications: no    Last Vitals:  Filed Vitals:   11/22/15 0234 11/22/15 0553  BP: 114/58 121/77  Pulse: 92 104  Temp: 36.6 C 36.5 C  Resp: 18 18    Last Pain:  Filed Vitals:   11/22/15 0623  PainSc: 6                  Urbano Milhouse

## 2015-11-22 NOTE — Anesthesia Postprocedure Evaluation (Deleted)
Anesthesia Post Note  Patient: Rebecca Duran  Procedure(s) Performed: * No procedures listed *  Patient location during evaluation: Mother Baby Anesthesia Type: Epidural Level of consciousness: awake and alert, oriented and awake Pain management: pain level controlled Vital Signs Assessment: post-procedure vital signs reviewed and stable Respiratory status: spontaneous breathing, nonlabored ventilation and respiratory function stable Cardiovascular status: stable Postop Assessment: no headache, no backache, patient able to bend at knees and no signs of nausea or vomiting Anesthetic complications: no    Last Vitals:  Filed Vitals:   11/22/15 0234 11/22/15 0553  BP: 114/58 121/77  Pulse: 92 104  Temp: 36.6 C 36.5 C  Resp: 18 18    Last Pain:  Filed Vitals:   11/22/15 0623  PainSc: 6                  Chase Arnall

## 2015-11-22 NOTE — Progress Notes (Signed)
Post Partum Day 1 Subjective: no complaints  Objective: Blood pressure 121/77, pulse 104, temperature 97.7 F (36.5 C), temperature source Oral, resp. rate 18, height 5' 4.5" (1.638 m), weight 169 lb (76.658 kg), last menstrual period 02/17/2015, SpO2 99 %, unknown if currently breastfeeding.  Physical Exam:  General: alert, cooperative and appears stated age Lochia: appropriate Uterine Fundus: firm Incision:  DVT Evaluation: No evidence of DVT seen on physical exam.   Recent Labs  11/21/15 0215  HGB 9.5*  HCT 30.0*    Assessment/Plan: Plan for discharge tomorrow, Breastfeeding and Lactation consult   LOS: 1 day   Clemmons,Lori Grissett 11/22/2015, 7:56 AM

## 2015-11-22 NOTE — Progress Notes (Signed)
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   Referral Source:  Central Nursery   Address:  [address] [place], [place] [address]  Phone number:  [phone]   Household Members:  Self, Parents, Minor Children   Natural Supports (not living in the home):  Immediate Family, Parent, Spouse/significant other   Professional Supports: None   Employment: Full-time   Type of Work: Day Care    Education:  College graduate   Financial Resources:  Private Insurance   Other Resources:    Cultural/Religious Considerations Which May Impact Care:  None reported   Strengths:  Ability to meet basic needs , Home prepared for child    Risk Factors/Current Problems:  Substance Use- MOB reported that she stopped smoking marijuana once she found out she was pregnant around September 2016. MOB had a positive UDS on 04/2015 taken at [facility]. She had two negative UDS on 08/2015 and 10/2015 per chart review. MOB denied smoking marijuana after her last positive drug screen in September.    Cognitive State:  Insightful , Linear Thinking , Able to Concentrate , Goal Oriented    Mood/Affect:  Happy , Interested , Comfortable , Relaxed , Calm    CSW Assessment:  MSW intern presented in patient's room due to a consult being placed by the central nursery because of THC use during the pregnancy. FOB and MOB's friend where present in the room when MSW intern engaged. MOB provided verbal consent for MSW intern to engage.  MOB expressed the birthing process was different than she anticipated and voiced being in pain because of the epidural. Per MOB, she had to be given the epidural four times because of a curve on her spine. MOB complained about her back being sore but shared she was being given pain medicine. MOB stated the infant had to be "vacummed" out during the birthing process. According to MOB, even though the birthing process was more painful than she had expected she was glad that both where healthy and she was recovering well into postpartum. MOB voiced breast feeding going well and shared that the infant is latching on well. MOB denied having any questions or concerns about breast feeding.   MOB disclosed she is currently living with her father and little brother. 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 MOB stated she has 6 weeks remaining of FMLA and expects to return back to work May 15. MOB expressed her employer being supportive throughout the whole pregnancy. FOB reported he works third shift and will be taking care of the infant during the day once MOB returns to work. FOB described his work schedule to be flexible and working alternate days during the week. Both MOB and FOB stated they have met all of the infant's basic needs and are prepared to go home. MOB disclosed the infant will be going to [facility] and will be seen by Dr. Copper   since he was her pediatrician and is her nieces pediatrician as well.   MSW intern inquired about MOB's mental health during the pregnancy. MOB described her pregnancy to be a "happy" and "pleasant" experience. MOB denied any mental health concerns prior to or during the pregnancy. MOB voiced that the only time she was overwhelmed during her pregnancy was when she was sick, nauseous and throwing up. MSW intern provided education on PMAD's and the hospital's support group, Feelings After Birth. MOB also shared the importance of a good  support sytem and getting enough sleep for MOB's mental and physical health recovery. MOB denied having any further questions and concerns regarding the topic. MSW intern left MOB additional information on the topic for her to take home. MOB thanked MSW intern for the information provided.   MSW intern asked MOB about her self-reported  THC consumption during the pregnancy. MOB shared she had stopped smoking in September 2016 once she found out she was pregnant. MOB had a positive UDS on 04/2015 taken at [facility]. Per, MOB she smoked occassionally and was unable to give me an exact amount of her THC consumption.  MOB denied smoking marijuana since then. MSW intern informed MOB that her last two UDS were negative (08/2015 and 10/2015). MSW intern also let MOB know the infant's UDS was negative as well. MSW intern informed MOB and FOB about the hospital's drug screening policy and the pending cord tissue drug screen. MOB denied having any concerns or questions and was understanding of the information provided.   MOB and FOB denied having any further questions or concerns but agreed to contact MSW intern if needs arise.  CSW Plan/Description:   Patient/Family Education- MSW intern provided education on perinatal mood disorders and the hospital's support group.  MSW intern to monitor cord tissue screening and  file a Child Protective Service Report as needed. Infant UDS is negative.  No Further Intervention Required/No Barriers to Discharge    Betrice Wanat, Student-SW 11/22/2015, 1:34 PM  

## 2015-11-22 NOTE — Lactation Note (Signed)
This note was copied from a baby's chart. Lactation Consultation Note New mom states BF going well. Denies painful latch. Mom listened to teaching, wasn't to engaged in information. States she is going to breast & formula feed at home. Mom encouraged to feed baby 8-12 times/24 hours and with feeding cues. Encouraged to call for assistance if needed and to verify proper latch.Referred to Baby and Me Book in Breastfeeding section Pg. 22-23 for position options and Proper latch demonstration.[facility] brochure given w/resources, support groups and LC services. [name] ZOXWR'UToday's Date: 11/22/2015 Reason for consult: Initial assessment   Maternal Data Has patient been taught Hand Expression?: No (states she knows how) Does the patient have breastfeeding experience prior to this delivery?: No  Feeding Feeding Type: Breast Fed Length of feed: 15 min  LATCH Score/Interventions Latch: Grasps breast easily, tongue down, lips flanged, rhythmical sucking.  Audible Swallowing: A few with stimulation  Type of Nipple: Everted at rest and after stimulation  Comfort (Breast/Nipple): Soft / non-tender     Hold (Positioning): Full assist, staff holds infant at breast  LATCH Score: 7  Lactation Tools Discussed/Used     Consult Status Consult Status: Follow-up Date: 11/23/15 Follow-up type: In-patient    Charyl DancerCARVER, Emony Dormer G 11/22/2015, 12:23 AM

## 2015-11-23 DIAGNOSIS — Z8759 Personal history of other complications of pregnancy, childbirth and the puerperium: Secondary | ICD-10-CM

## 2015-11-23 MED ORDER — IBUPROFEN 600 MG PO TABS: 600.0000 mg | ORAL_TABLET | Freq: Four times a day (QID) | ORAL | Status: DC

## 2015-11-23 MED ORDER — NORETHINDRONE 0.35 MG PO TABS: 1.0000 | ORAL_TABLET | Freq: Every day | ORAL | Status: DC

## 2015-11-23 NOTE — Discharge Summary (Signed)
OB Discharge Summary     Patient Name: Rebecca RoteJanaya Marquess DOB: June 29, 1996 MRN: [id number]  Date of admission: 11/20/2015 Delivering MD: Levie HeritageSTINSON, JACOB J   Date of discharge: 11/23/2015  Admitting diagnosis: 39 WEEKS CTX Intrauterine pregnancy: 7861w4d     Secondary diagnosis:  Active Problems:   Indication for care in labor or delivery   Status post vacuum-assisted vaginal delivery  Additional problems: none     Discharge diagnosis: Term Pregnancy Delivered                                                                                                Post partum procedures:none  Augmentation: none  Complications: post partum fever.  Resolved without abx.  Hospital course:  Onset of Labor With Vaginal Delivery     20 y.o. yo G1P1001 at 5961w4d was admitted in Latent Labor on 11/20/2015. Patient had an uncomplicated labor course as follows:  Membrane Rupture Time/Date: 6:00 AM ,11/21/2015   Intrapartum Procedures: Episiotomy: None [1]                                         Lacerations:  2nd degree [3]  Patient had a delivery of a Viable infant. 11/21/2015  Information for the patient's newborn:  Rebecca CharlesColeman, Boy Rebecca Duran [[id number]][[id number]]  Delivery Method: Vaginal, Vacuum (Extractor) (Filed from Delivery Summary)    Pateint had an uncomplicated postpartum course.  She is ambulating, tolerating a regular diet, passing flatus, and urinating well. Patient is discharged home in stable condition on 11/23/2015.    Physical exam  Filed Vitals:   11/22/15 0234 11/22/15 0553 11/22/15 1744 11/23/15 0516  BP: 114/58 121/77 129/78 116/67  Pulse: 92 104 96 85  Temp: 97.9 F (36.6 C) 97.7 F (36.5 C) 98.1 F (36.7 C) 97.4 F (36.3 C)  TempSrc: Oral Oral Oral Oral  Resp: 18 18 18 16   Height:      Weight:      SpO2:       General: alert, cooperative and no distress Lochia: appropriate Uterine Fundus: firm Incision: N/A DVT Evaluation: No evidence of DVT seen on physical exam. Negative Homan's  sign. No cords or calf tenderness. No significant calf/ankle edema. Labs: Lab Results  Component Value Date   WBC 23.9* 11/21/2015   HGB 9.5* 11/21/2015   HCT 30.0* 11/21/2015   MCV 81.7 11/21/2015   PLT 337 11/21/2015   No flowsheet data found.  Discharge instruction: per After Visit Summary and "Baby and Me Booklet".  After visit meds:    Medication List    TAKE these medications        CONCEPT OB 130-92.4-1 MG Caps  Take 1 tablet by mouth daily.     ibuprofen 600 MG tablet  Commonly known as:  ADVIL,MOTRIN  Take 1 tablet (600 mg total) by mouth every 6 (six) hours.     norethindrone 0.35 MG tablet  Commonly known as:  ORTHO MICRONOR  Take 1 tablet (0.35 mg total) by mouth daily.  Diet: routine diet  Activity: Advance as tolerated. Pelvic rest for 6 weeks.   Outpatient follow up:6 weeks Follow up Appt:No future appointments. Follow up Visit:No Follow-up on file.  Postpartum contraception: Progesterone only pills  Newborn Data: Live born female  Birth Weight: 6 lb 12.8 oz (3085 g) APGAR: 8, 9  Baby Feeding: Breast Disposition:home with mother   11/23/2015 [name], DO  OB fellow attestation I have seen and examined this patient and agree with above documentation in the resident's note.   Tammela Bales is a 20 y.o. G1P1001 s/p VAVD.   Pain is well controlled.  Plan for birth control is oral contraceptives (estrogen/progesterone).  Method of Feeding: breast  PE:  BP 116/67 mmHg  Pulse 85  Temp(Src) 97.4 F (36.3 C) (Oral)  Resp 16  Ht 5' 4.5" (1.638 m)  Wt 169 lb (76.658 kg)  BMI 28.57 kg/m2  SpO2 99%  LMP 02/17/2015  Breastfeeding? Unknown Gen: well appearing Heart: reg rate Lungs: normal WOB Fundus firm Ext: soft, no pain, no edema   Recent Labs  11/21/15 0215  HGB 9.5*  HCT 30.0*     Plan: discharge today - postpartum care discussed - f/u clinic in 6 weeks for postpartum visit   Federico Flake,  MD 10:08 AM

## 2015-11-23 NOTE — Discharge Instructions (Signed)
Places to have your son circumcised:    [facility] [phone] $480 by 4 wks  [facility] [phone] $244 by 4 wks  [facility] [phone] $175 by 2 wks  [facility] [phone] $250 by 7 days [facility] [phone] $[phone] by 4 wks  These prices sometimes change but are roughly what you can expect to pay. Please call and confirm pricing.   Circumcision is considered an elective/non-medically necessary procedure. There are many reasons parents decide to have their sons circumsized. During the first year of life circumcised males have a reduced risk of urinary tract infections but after this year the rates between circumcised males and uncircumcised males are the same.  It is safe to have your son circumcised outside of the hospital and the places above perform them regularly.   Nothing per vagina for 6 weeks.  No intercourse.  No tampons.  No douching.  Take your birth control pill at the same time every day. Postpartum Care After Vaginal Delivery After you deliver your newborn (postpartum period), the usual stay in the hospital is 24-72 hours. If there were problems with your labor or delivery, or if you have other medical problems, you might be in the hospital longer.  While you are in the hospital, you will receive help and instructions on how to care for yourself and your newborn during the postpartum period.  While you are in the hospital:  Be sure to tell your nurses if you have pain or discomfort, as well as where you feel the pain and what makes the pain worse.  If you had an incision made near your vagina (episiotomy) or if you had some tearing during delivery, the nurses may put ice packs on your episiotomy or tear. The ice packs may help to reduce the pain and swelling.  If you are breastfeeding, you may feel uncomfortable  contractions of your uterus for a couple of weeks. This is normal. The contractions help your uterus get back to normal size.  It is normal to have some bleeding after delivery.  For the first 1-3 days after delivery, the flow is red and the amount may be similar to a period.  It is common for the flow to start and stop.  In the first few days, you may pass some small clots. Let your nurses know if you begin to pass large clots or your flow increases.  Do not  flush blood clots down the toilet before having the nurse look at them.  During the next 3-10 days after delivery, your flow should become more watery and pink or brown-tinged in color.  Ten to fourteen days after delivery, your flow should be a small amount of yellowish-white discharge.  The amount of your flow will decrease over the first few weeks after delivery. Your flow may stop in 6-8 weeks. Most women have had their flow stop by 12 weeks after delivery.  You should change your sanitary pads frequently.  Wash your hands thoroughly with soap and water for at least 20 seconds after changing pads, using the toilet, or before holding or feeding your newborn.  You should feel like you need to empty your bladder within the first 6-8 hours after delivery.  In case you become weak, lightheaded, or faint, call your nurse before you get out of bed for the first time and before you take a shower for the first time.  Within the first few days after delivery, your breasts may begin to feel tender and full. This  is called engorgement. Breast tenderness usually goes away within 48-72 hours after engorgement occurs. You may also notice milk leaking from your breasts. If you are not breastfeeding, do not stimulate your breasts. Breast stimulation can make your breasts produce more milk.  Spending as much time as possible with your newborn is very important. During this time, you and your newborn can feel close and get to know each other.  Having your newborn stay in your room (rooming in) will help to strengthen the bond with your newborn. It will give you time to get to know your newborn and become comfortable caring for your newborn.  Your hormones change after delivery. Sometimes the hormone changes can temporarily cause you to feel sad or tearful. These feelings should not last more than a few days. If these feelings last longer than that, you should talk to your caregiver.  If desired, talk to your caregiver about methods of family planning or contraception.  Talk to your caregiver about immunizations. Your caregiver may want you to have the following immunizations before leaving the hospital:  Tetanus, diphtheria, and pertussis (Tdap) or tetanus and diphtheria (Td) immunization. It is very important that you and your family (including grandparents) or others caring for your newborn are up-to-date with the Tdap or Td immunizations. The Tdap or Td immunization can help protect your newborn from getting ill.  Rubella immunization.  Varicella (chickenpox) immunization.  Influenza immunization. You should receive this annual immunization if you did not receive the immunization during your pregnancy.   This information is not intended to replace advice given to you by your health care provider. Make sure you discuss any questions you have with your health care provider.   Document Released: 06/03/2007 Document Revised: 04/30/2012 Document Reviewed: 04/02/2012 Elsevier Interactive Patient Education [employer]2016 Elsevier Inc.

## 2015-11-23 NOTE — Lactation Note (Signed)
This note was copied from a baby's chart. Lactation Consultation Note  Patient Name: Rebecca Larwance RoteJanaya Duran QMVHQ'IToday's Date: 11/23/2015 Reason for consult: Follow-up assessment  Baby is 41 hours old 3% weight loss, Bili - check - 32 hours old - 4.4. Baby was consistently breast feeding and per mom breast are getting fuller. Baby showing feeding cues, baby skin to skin, mom trying to latch in cradle position.  Baby having a difficult time obtaining depth. LC showed mom a deeper latch, multiply swallows,  And baby fed 15 mins , and released on his own and seemed satisfied, fell asleep.  Mom denies sore nipple. Sore nipple and engorgement prevention and tx reviewed  Referred to Baby and me booklet pages 24 -25.  LC reviewed basics. Per mom has a DEBP and is comfortable knowing how to use it.  Mother informed of post-discharge support and given phone number to the lactation department,  including services for phone call assistance; out-patient appointments; and breastfeeding support group.  List of other breastfeeding resources in the community given in the handout. Encouraged mother to call  for problems or concerns related to breastfeeding.   Maternal Data Has patient been taught Hand Expression?: Yes  Feeding Feeding Type: Breast Fed Length of feed: 15 min (LC observed feedings , increased w/ Breast compressions)  LATCH Score/Interventions Latch:  (latched with depth ) Intervention(s): Adjust position;Breast massage;Assist with latch;Breast compression  Audible Swallowing:  (multiply swallows )  Type of Nipple:  (nipple appeared normal after baby baby released )  Comfort (Breast/Nipple):  (per mom comfortable )     Hold (Positioning): Assistance needed to correctly position infant at breast and maintain latch. Intervention(s): Breastfeeding basics reviewed  LATCH Score: 8  Lactation Tools Discussed/Used WIC Program: Yes (per mom ) Pump Review: Milk Storage Initiated by:: MAI   Date initiated:: 11/23/15   Consult Status Consult Status: Complete Date: 11/23/15 Follow-up type: In-patient    Kathrin Greathouseorio, Dmarcus Decicco Ann 11/23/2015, 10:00 AM

## 2017-07-26 ENCOUNTER — Other Ambulatory Visit: Payer: Self-pay

## 2017-07-26 ENCOUNTER — Emergency Department (HOSPITAL_COMMUNITY)
Admission: EM | Admit: 2017-07-26 | Discharge: 2017-07-26 | Disposition: A | Discharge status: Home / Self Care | Payer: Medicaid Other | Attending: Emergency Medicine | Admitting: Emergency Medicine

## 2017-07-26 DIAGNOSIS — R102 Pelvic and perineal pain: Secondary | ICD-10-CM | POA: Insufficient documentation

## 2017-07-26 DIAGNOSIS — N939 Abnormal uterine and vaginal bleeding, unspecified: Secondary | ICD-10-CM | POA: Insufficient documentation

## 2017-07-26 DIAGNOSIS — Z5321 Procedure and treatment not carried out due to patient leaving prior to being seen by health care provider: Secondary | ICD-10-CM | POA: Diagnosis not present

## 2017-07-26 LAB — POC URINE PREG, ED: PREG TEST UR: NEGATIVE

## 2017-07-26 NOTE — ED Triage Notes (Signed)
Pt states last normal period was last month on the 6th. PT thought she was coming on her cycle yesterday, when she had a "pink stringy clot" and states that when she stood up out of the bath "a gush of water came out" of her vagina. Pt now has pelvic pain to lower pelvis and vaginal pain. Pt states cramping and vaginal pressure.

## 2017-07-31 ENCOUNTER — Ambulatory Visit (HOSPITAL_COMMUNITY)
Admission: EM | Admit: 2017-07-31 | Discharge: 2017-07-31 | Disposition: A | Discharge status: Home / Self Care | Payer: Medicaid Other | Attending: Family Medicine | Admitting: Family Medicine

## 2017-07-31 ENCOUNTER — Encounter (HOSPITAL_COMMUNITY): Payer: Self-pay | Admitting: Emergency Medicine

## 2017-07-31 DIAGNOSIS — N898 Other specified noninflammatory disorders of vagina: Secondary | ICD-10-CM | POA: Insufficient documentation

## 2017-07-31 DIAGNOSIS — R350 Frequency of micturition: Secondary | ICD-10-CM | POA: Diagnosis not present

## 2017-07-31 DIAGNOSIS — R109 Unspecified abdominal pain: Secondary | ICD-10-CM | POA: Diagnosis present

## 2017-07-31 LAB — POCT URINALYSIS DIP (DEVICE)
BILIRUBIN URINE: NEGATIVE
GLUCOSE, UA: NEGATIVE mg/dL
Ketones, ur: NEGATIVE mg/dL
NITRITE: NEGATIVE
Protein, ur: NEGATIVE mg/dL
SPECIFIC GRAVITY, URINE: 1.02 (ref 1.005–1.030)
UROBILINOGEN UA: 0.2 mg/dL (ref 0.0–1.0)
pH: 6.5 (ref 5.0–8.0)

## 2017-07-31 LAB — POCT PREGNANCY, URINE: Preg Test, Ur: NEGATIVE

## 2017-07-31 MED ORDER — SULFAMETHOXAZOLE-TRIMETHOPRIM 800-160 MG PO TABS: 1.0000 | ORAL_TABLET | Freq: Two times a day (BID) | ORAL | 0 refills | Status: AC

## 2017-07-31 MED ORDER — FLUCONAZOLE 150 MG PO TABS: ORAL_TABLET | ORAL | 1 refills | Status: DC

## 2017-07-31 NOTE — Discharge Instructions (Signed)
We have sent testing for sexually transmitted infections in addition to a urine culture. We will notify you of any positive results once they are received. If required, we will prescribe any medications you might need. ° °

## 2017-07-31 NOTE — ED Triage Notes (Signed)
PT reports she was treated for BV and chlamydia 1 month ago. PT reports vaginal itching and abdominal pain since yesterday. PT was seen in ER for abdominal pain 6 days ago, but did not wait to be seen.

## 2017-07-31 NOTE — ED Notes (Signed)
Sent patient to the bathroom with instructions for obtaining a dirty and clean urine.

## 2017-08-01 LAB — URINE CYTOLOGY ANCILLARY ONLY
Chlamydia: NEGATIVE
Neisseria Gonorrhea: NEGATIVE
TRICH (WINDOWPATH): NEGATIVE

## 2017-08-03 NOTE — ED Provider Notes (Signed)
[facility]   [id number] 07/31/17 Arrival Time: 1338  ASSESSMENT & PLAN:  1. Abdominal cramping   2. Urinary frequency   3. Vaginal itching     Meds ordered this encounter  Medications  . sulfamethoxazole-trimethoprim (BACTRIM DS,SEPTRA DS) 800-160 MG tablet    Sig: Take 1 tablet by mouth 2 (two) times daily for 3 days.    Dispense:  6 tablet    Refill:  0  . fluconazole (DIFLUCAN) 150 MG tablet    Sig: Take one tablet by mouth as a single dose. May repeat after 3 days if needed.    Dispense:  1 tablet    Refill:  1    Urine cytology sent. Will notify of any positive results. Instructed to refrain from sexual activity for at least seven days. Urine culture sent.  Reviewed expectations re: course of current medical issues. Questions answered. Outlined signs and symptoms indicating need for more acute intervention. Patient verbalized understanding. After Visit Summary given.   SUBJECTIVE:  Rebecca Duran is a 21 y.o. female who presents with complaint of vaginal discharge. Treated for Chlamydia approx 1 month ago. Onset of vaginal d/c gradual, several days ago. No sexual intercourse/activity since Chlamydia treatment. Describes discharge as thick and white. Assoc with vaginal itching. Urinary symptoms: urinary frequency. Afebrile. No abdominal or pelvic pain. No n/v. No rashes or lesions. OTC treatment: None.  Patient's last menstrual period was 07/26/2017.  ROS: As per HPI.  OBJECTIVE:  Vitals:   07/31/17 1443 07/31/17 1445  BP: 132/77   Pulse: 75   Resp: 16   Temp: 98.6 F (37 C)   TempSrc: Oral   SpO2: 100%   Weight:  120 lb (54.4 kg)  Height:  5\' 4"  (1.626 m)    General appearance: alert, cooperative, appears stated age and no distress Throat: lips, mucosa, and tongue normal; teeth and gums normal Back: no CVA tenderness Abdomen: soft, non-tender; bowel sounds normal; no masses or organomegaly; no guarding or rebound tenderness GU:  declines Skin: warm and dry Psychological:  Alert and cooperative. Normal mood and affect.  Results for orders placed or performed during the hospital encounter of 07/31/17  POCT urinalysis dip (device)  Result Value Ref Range   Glucose, UA NEGATIVE NEGATIVE mg/dL   Bilirubin Urine NEGATIVE NEGATIVE   Ketones, ur NEGATIVE NEGATIVE mg/dL   Specific Gravity, Urine 1.020 1.005 - 1.030   Hgb urine dipstick MODERATE (A) NEGATIVE   pH 6.5 5.0 - 8.0   Protein, ur NEGATIVE NEGATIVE mg/dL   Urobilinogen, UA 0.2 0.0 - 1.0 mg/dL   Nitrite NEGATIVE NEGATIVE   Leukocytes, UA SMALL (A) NEGATIVE  Pregnancy, urine POC  Result Value Ref Range   Preg Test, Ur NEGATIVE NEGATIVE    Labs Reviewed  POCT URINALYSIS DIP (DEVICE) - Abnormal; Notable for the following components:      Result Value   Hgb urine dipstick MODERATE (*)    Leukocytes, UA SMALL (*)    All other components within normal limits  POCT PREGNANCY, URINE  URINE CYTOLOGY ANCILLARY ONLY    No results found.  No Known Allergies  Past Medical History:  Diagnosis Date  . Medical history non-contributory    Family History  Problem Relation Age of Onset  . Cancer Neg Hx   . Diabetes Neg Hx   . Hypertension Neg Hx    Social History   Socioeconomic History  . Marital status: Single    Spouse name: Not on file  .  Number of children: Not on file  . Years of education: Not on file  . Highest education level: Not on file  Social Needs  . Financial resource strain: Not on file  . Food insecurity - worry: Not on file  . Food insecurity - inability: Not on file  . Transportation needs - medical: Not on file  . Transportation needs - non-medical: Not on file  Occupational History  . Not on file  Tobacco Use  . Smoking status: Never Smoker  . Smokeless tobacco: Never Used  Substance and Sexual Activity  . Alcohol use: No  . Drug use: No  . Sexual activity: Yes    Birth control/protection: None  Other Topics Concern  .  Not on file  Social History Narrative  . Not on file          Mardella LaymanHagler, Shemicka Cohrs, MD 08/03/17 [phone]

## 2017-08-05 LAB — URINE CYTOLOGY ANCILLARY ONLY
BACTERIAL VAGINITIS: NEGATIVE
CANDIDA VAGINITIS: NEGATIVE

## 2017-09-05 ENCOUNTER — Ambulatory Visit (HOSPITAL_COMMUNITY)
Admission: EM | Admit: 2017-09-05 | Discharge: 2017-09-05 | Disposition: A | Discharge status: Home / Self Care | Payer: Medicaid Other | Attending: Emergency Medicine | Admitting: Emergency Medicine

## 2017-09-05 ENCOUNTER — Encounter (HOSPITAL_COMMUNITY): Payer: Self-pay

## 2017-09-05 ENCOUNTER — Other Ambulatory Visit: Payer: Self-pay

## 2017-09-05 DIAGNOSIS — N3000 Acute cystitis without hematuria: Secondary | ICD-10-CM | POA: Diagnosis not present

## 2017-09-05 DIAGNOSIS — N3090 Cystitis, unspecified without hematuria: Secondary | ICD-10-CM | POA: Diagnosis not present

## 2017-09-05 DIAGNOSIS — N898 Other specified noninflammatory disorders of vagina: Secondary | ICD-10-CM

## 2017-09-05 LAB — POCT URINALYSIS DIP (DEVICE)
Bilirubin Urine: NEGATIVE
Glucose, UA: NEGATIVE mg/dL
HGB URINE DIPSTICK: NEGATIVE
Ketones, ur: NEGATIVE mg/dL
Leukocytes, UA: NEGATIVE
NITRITE: NEGATIVE
PH: 6.5 (ref 5.0–8.0)
PROTEIN: NEGATIVE mg/dL
Specific Gravity, Urine: >=1.03 (ref 1.005–1.030)
Urobilinogen, UA: 0.2 mg/dL (ref 0.0–1.0)

## 2017-09-05 LAB — POCT PREGNANCY, URINE: Preg Test, Ur: NEGATIVE

## 2017-09-05 MED ORDER — CEPHALEXIN 500 MG PO CAPS: 500.0000 mg | ORAL_CAPSULE | Freq: Four times a day (QID) | ORAL | 0 refills | Status: DC

## 2017-09-05 MED ORDER — PHENAZOPYRIDINE HCL 200 MG PO TABS: 200.0000 mg | ORAL_TABLET | Freq: Three times a day (TID) | ORAL | 0 refills | Status: DC

## 2017-09-05 MED ORDER — FLUCONAZOLE 150 MG PO TABS: ORAL_TABLET | ORAL | 2 refills | Status: DC

## 2017-09-05 NOTE — ED Triage Notes (Signed)
[facility] for [facility], symptoms include itching and cramps.

## 2017-09-05 NOTE — Discharge Instructions (Signed)
Take the medication with food  Take the full dose  Stay hydrated well

## 2017-09-05 NOTE — ED Provider Notes (Signed)
[facility]    CSN: [id number] Arrival date & time: 09/05/17  1858     History   Chief Complaint Chief Complaint  Patient presents with  . Cystitis    HPI Rebecca Duran is a 22 y.o. female.   Pt is here for frequency pressure while voiding. States that she was seen a few weeks ago for the same thing and was dx with uti. Did not feel as tho it went away completely and has some vaginal itching external. Denies any vaginal discharge, no cv tenderness. Has not taken aything pta       Past Medical History:  Diagnosis Date  . Medical history non-contributory     Patient Active Problem List   Diagnosis Date Noted  . Status post vacuum-assisted vaginal delivery 11/23/2015  . Indication for care in labor or delivery 11/21/2015    Past Surgical History:  Procedure Laterality Date  . NO PAST SURGERIES      OB History    Gravida Para Term Preterm AB Living   1 1 1     1    SAB TAB Ectopic Multiple Live Births         0 1       Home Medications    Prior to Admission medications   Medication Sig Start Date End Date Taking? Authorizing Provider  cephALEXin (KEFLEX) 500 MG capsule Take 1 capsule (500 mg total) by mouth 4 (four) times daily. 09/05/17   Coralyn Mark, NP  fluconazole (DIFLUCAN) 150 MG tablet Take one tablet by mouth as a single dose. May repeat after 3 days if needed. 09/05/17   Coralyn Mark, NP  ibuprofen (ADVIL,MOTRIN) 600 MG tablet Take 1 tablet (600 mg total) by mouth every 6 (six) hours. 11/23/15   Raliegh Ip, DO  norethindrone (ORTHO MICRONOR) 0.35 MG tablet Take 1 tablet (0.35 mg total) by mouth daily. 11/23/15   Raliegh Ip, DO  phenazopyridine (PYRIDIUM) 200 MG tablet Take 1 tablet (200 mg total) by mouth 3 (three) times daily. 09/05/17   Coralyn Mark, NP  Prenat w/o A Vit-FeFum-FePo-FA (CONCEPT OB) 130-92.4-1 MG CAPS Take 1 tablet by mouth daily. 03/29/15   Dorathy Kinsman, CNM    Family History Family  History  Problem Relation Age of Onset  . Cancer Neg Hx   . Diabetes Neg Hx   . Hypertension Neg Hx     Social History Social History   Tobacco Use  . Smoking status: Never Smoker  . Smokeless tobacco: Never Used  Substance Use Topics  . Alcohol use: No  . Drug use: No     Allergies   Patient has no known allergies.   Review of Systems Review of Systems  Constitutional: Negative.   Respiratory: Negative.   Cardiovascular: Negative.   Gastrointestinal: Positive for abdominal pain.  Genitourinary: Positive for frequency, pelvic pain and urgency.  Skin: Negative.   Neurological: Negative.      Physical Exam Triage Vital Signs ED Triage Vitals [09/05/17 1929]  Enc Vitals Group     BP 117/72     Pulse Rate 80     Resp 16     Temp 98.2 F (36.8 C)     Temp Source Oral     SpO2 100 %     Weight      Height      Head Circumference      Peak Flow      Pain Score  Pain Loc      Pain Edu?      Excl. in GC?    No data found.  Updated Vital Signs BP 117/72 (BP Location: Left Arm)   Pulse 80   Temp 98.2 F (36.8 C) (Oral)   Resp 16   LMP 09/02/2017 (Exact Date)   SpO2 100%   Visual Acuity Right Eye Distance:   Left Eye Distance:   Bilateral Distance:    Right Eye Near:   Left Eye Near:    Bilateral Near:     Physical Exam  Constitutional: She appears well-developed.  Cardiovascular: Normal rate and regular rhythm.  Pulmonary/Chest: Effort normal and breath sounds normal.  Abdominal: Soft. Bowel sounds are normal. There is tenderness.  Lower abd and pelvic area tenderness , -cv tenderness   Genitourinary:  Genitourinary Comments: Erythema to external vaginal wall   Neurological: She is alert.     UC Treatments / Results  Labs (all labs ordered are listed, but only abnormal results are displayed) Labs Reviewed  POCT URINALYSIS DIP (DEVICE)  POCT PREGNANCY, URINE    EKG  EKG Interpretation None       Radiology No results  found.  Procedures Procedures (including critical care time)  Medications Ordered in UC Medications - No data to display   Initial Impression / Assessment and Plan / UC Course  I have reviewed the triage vital signs and the nursing notes.  Pertinent labs & imaging results that were available during my care of the patient were reviewed by me and considered in my medical decision making (see chart for details).     Will need to follow up for urine culture post treatment   Final Clinical Impressions(s) / UC Diagnoses   Final diagnoses:  Cystitis  Itching in the vaginal area  Acute cystitis without hematuria    ED Discharge Orders        Ordered    fluconazole (DIFLUCAN) 150 MG tablet     09/05/17 1959    cephALEXin (KEFLEX) 500 MG capsule  4 times daily     09/05/17 1959    phenazopyridine (PYRIDIUM) 200 MG tablet  3 times daily     09/05/17 1959       Controlled Substance Prescriptions [place] Controlled Substance Registry consulted? Not Applicable   Coralyn MarkMitchell, Carolann Brazell L, NP 09/05/17 2004

## 2017-09-16 ENCOUNTER — Encounter (HOSPITAL_COMMUNITY): Payer: Self-pay | Admitting: Emergency Medicine

## 2017-09-16 ENCOUNTER — Ambulatory Visit (HOSPITAL_COMMUNITY)
Admission: EM | Admit: 2017-09-16 | Discharge: 2017-09-16 | Disposition: A | Discharge status: Home / Self Care | Payer: Medicaid Other | Attending: Family Medicine | Admitting: Family Medicine

## 2017-09-16 DIAGNOSIS — N898 Other specified noninflammatory disorders of vagina: Secondary | ICD-10-CM | POA: Insufficient documentation

## 2017-09-16 DIAGNOSIS — Z113 Encounter for screening for infections with a predominantly sexual mode of transmission: Secondary | ICD-10-CM

## 2017-09-16 DIAGNOSIS — Z79899 Other long term (current) drug therapy: Secondary | ICD-10-CM | POA: Insufficient documentation

## 2017-09-16 DIAGNOSIS — R1030 Lower abdominal pain, unspecified: Secondary | ICD-10-CM

## 2017-09-16 DIAGNOSIS — Z3202 Encounter for pregnancy test, result negative: Secondary | ICD-10-CM | POA: Diagnosis not present

## 2017-09-16 DIAGNOSIS — N760 Acute vaginitis: Secondary | ICD-10-CM | POA: Diagnosis present

## 2017-09-16 LAB — POCT PREGNANCY, URINE: PREG TEST UR: NEGATIVE

## 2017-09-16 LAB — POCT URINALYSIS DIP (DEVICE)
BILIRUBIN URINE: NEGATIVE
Glucose, UA: NEGATIVE mg/dL
Hgb urine dipstick: NEGATIVE
Ketones, ur: NEGATIVE mg/dL
LEUKOCYTES UA: NEGATIVE
Nitrite: NEGATIVE
Protein, ur: NEGATIVE mg/dL
Specific Gravity, Urine: 1.025 (ref 1.005–1.030)
UROBILINOGEN UA: 0.2 mg/dL (ref 0.0–1.0)
pH: 6.5 (ref 5.0–8.0)

## 2017-09-16 MED ORDER — FLUCONAZOLE 150 MG PO TABS: 150.0000 mg | ORAL_TABLET | Freq: Every day | ORAL | 0 refills | Status: DC

## 2017-09-16 MED ORDER — CEFTRIAXONE SODIUM 250 MG IJ SOLR
INTRAMUSCULAR | Status: AC
  Filled 2017-09-16: qty 250

## 2017-09-16 MED ORDER — AZITHROMYCIN 250 MG PO TABS
1000.0000 mg | ORAL_TABLET | Freq: Once | ORAL | Status: AC
  Administered 2017-09-16: 1000 mg via ORAL

## 2017-09-16 MED ORDER — CEFTRIAXONE SODIUM 250 MG IJ SOLR
250.0000 mg | Freq: Once | INTRAMUSCULAR | Status: AC
Start: 2017-09-16 — End: 2017-09-16
  Administered 2017-09-16: 250 mg via INTRAMUSCULAR

## 2017-09-16 MED ORDER — AZITHROMYCIN 250 MG PO TABS
ORAL_TABLET | ORAL | Status: AC
Start: 2017-09-16 — End: ?
  Filled 2017-09-16: qty 4

## 2017-09-16 NOTE — ED Provider Notes (Signed)
[facility]    CSN: [id number] Arrival date & time: 09/16/17  1153     History   Chief Complaint Chief Complaint  Patient presents with  . Vaginitis    HPI Rebecca Duran is a 22 y.o. female.   22 year old female comes in for 3-day history of vaginal discharge, itching. She was seen at the urgent care 09/05/2017 for UTI symptoms and was given Keflex. States urine feels "warmer" than usual but otherwise denies urinary symptoms such as frequency, dysuria, hematuria.  She has also had low abdominal pain, constant, cramps. Denies any obvious aggravating or alleviating factor.  Denies nausea, vomiting.  LMP 09/02/2017. States used [facility], which made the area burn without relief of symptoms. Sexually active with 1 partner, no condom use.       Past Medical History:  Diagnosis Date  . Medical history non-contributory     Patient Active Problem List   Diagnosis Date Noted  . Status post vacuum-assisted vaginal delivery 11/23/2015  . Indication for care in labor or delivery 11/21/2015    Past Surgical History:  Procedure Laterality Date  . NO PAST SURGERIES      OB History    Gravida Para Term Preterm AB Living   1 1 1     1    SAB TAB Ectopic Multiple Live Births         0 1       Home Medications    Prior to Admission medications   Medication Sig Start Date End Date Taking? Authorizing Provider  fluconazole (DIFLUCAN) 150 MG tablet Take 1 tablet (150 mg total) by mouth daily. Take second dose 72 hours later if symptoms still persists. 09/16/17   Cathie Hoops, Azyriah Nevins V, PA-C  ibuprofen (ADVIL,MOTRIN) 600 MG tablet Take 1 tablet (600 mg total) by mouth every 6 (six) hours. 11/23/15   Raliegh Ip, DO  norethindrone (ORTHO MICRONOR) 0.35 MG tablet Take 1 tablet (0.35 mg total) by mouth daily. 11/23/15   Raliegh Ip, DO  phenazopyridine (PYRIDIUM) 200 MG tablet Take 1 tablet (200 mg total) by mouth 3 (three) times daily. 09/05/17   Coralyn Mark, NP  Prenat  w/o A Vit-FeFum-FePo-FA (CONCEPT OB) 130-92.4-1 MG CAPS Take 1 tablet by mouth daily. 03/29/15   Dorathy Kinsman, CNM    Family History Family History  Problem Relation Age of Onset  . Cancer Neg Hx   . Diabetes Neg Hx   . Hypertension Neg Hx     Social History Social History   Tobacco Use  . Smoking status: Never Smoker  . Smokeless tobacco: Never Used  Substance Use Topics  . Alcohol use: No  . Drug use: No     Allergies   Patient has no known allergies.   Review of Systems Review of Systems  Reason unable to perform ROS: See HPI as above.     Physical Exam Triage Vital Signs ED Triage Vitals  Enc Vitals Group     BP 09/16/17 1316 (!) 119/55     Pulse Rate 09/16/17 1316 85     Resp 09/16/17 1316 20     Temp 09/16/17 1316 97.9 F (36.6 C)     Temp Source 09/16/17 1316 Oral     SpO2 09/16/17 1316 100 %     Weight --      Height --      Head Circumference --      Peak Flow --      Pain Score 09/16/17 1318  5     Pain Loc --      Pain Edu? --      Excl. in GC? --    No data found.  Updated Vital Signs BP (!) 119/55 (BP Location: Left Arm)   Pulse 85   Temp 97.9 F (36.6 C) (Oral)   Resp 20   LMP 09/02/2017 (Exact Date)   SpO2 100%   Breastfeeding? No   Physical Exam  Constitutional: She is oriented to person, place, and time. She appears well-developed and well-nourished. No distress.  HENT:  Head: Normocephalic and atraumatic.  Eyes: Conjunctivae are normal. Pupils are equal, round, and reactive to light.  Cardiovascular: Normal rate, regular rhythm and normal heart sounds. Exam reveals no gallop and no friction rub.  No murmur heard. Pulmonary/Chest: Effort normal and breath sounds normal. She has no wheezes. She has no rales.  Abdominal: Soft. Bowel sounds are normal. She exhibits no mass. There is no tenderness. There is no rebound, no guarding and no CVA tenderness.  Genitourinary: Uterus normal. There is no rash or tenderness on the right  labia. There is no rash or tenderness on the left labia. Uterus is not tender. Cervix exhibits discharge. Cervix exhibits no motion tenderness and no friability. Right adnexum displays no mass and no tenderness. Left adnexum displays no mass and no tenderness. Vaginal discharge found.  Neurological: She is alert and oriented to person, place, and time.  Skin: Skin is warm and dry.  Psychiatric: She has a normal mood and affect. Her behavior is normal. Judgment normal.    UC Treatments / Results  Labs (all labs ordered are listed, but only abnormal results are displayed) Labs Reviewed  URINE CULTURE  POCT URINALYSIS DIP (DEVICE)  POCT PREGNANCY, URINE  CERVICOVAGINAL ANCILLARY ONLY    EKG  EKG Interpretation None       Radiology No results found.  Procedures Procedures (including critical care time)  Medications Ordered in UC Medications  cefTRIAXone (ROCEPHIN) injection 250 mg (250 mg Intramuscular Given 09/16/17 1409)  azithromycin (ZITHROMAX) tablet 1,000 mg (1,000 mg Oral Given 09/16/17 1408)     Initial Impression / Assessment and Plan / UC Course  I have reviewed the triage vital signs and the nursing notes.  Pertinent labs & imaging results that were available during my care of the patient were reviewed by me and considered in my medical decision making (see chart for details).    Urine dipstick negative for pregnancy/UTI. Will send urine culture given patient feels that symptoms has not completely resolve. Patient was treated empirically for gonorrhea, chlamydia, yeast. Azithromycin and Rocephin given in office today.  Start Diflucan as directed. Cytology sent, patient will be contacted with any positive results that require additional treatment. Patient to refrain from sexual activity for the next 7 days. Return precautions given.   Final Clinical Impressions(s) / UC Diagnoses   Final diagnoses:  Vaginal discharge    ED Discharge Orders        Ordered     fluconazole (DIFLUCAN) 150 MG tablet  Daily     09/16/17 1356         Belinda FisherYu, Avrohom Mckelvin V, [place]PA-C 09/16/17 1412

## 2017-09-16 NOTE — ED Triage Notes (Signed)
PT C/O: vag d/c and itching onset 3 days .... Seen here on 09/05/17 for UTI sx and was given Keflex  Sx also include abd pain  Also wants to be checked for STD  Sexually active in a monogamous relationship w/no condoms  TAKING MEDS: Monistat  A&O x4... NAD... Ambulatory

## 2017-09-16 NOTE — Discharge Instructions (Signed)
You were treated empirically for gonorrhea, chlamydia, yeast.  Azithromycin 1g by mouth and Rocephin 250mg  injection given in office today.  Start Diflucan as directed.  Urine culture and cytology sent, you will be contacted with any positive results that requires further treatment. Refrain from sexual activity for the next 7 days. Monitor for any worsening of symptoms, fever, abdominal pain, nausea, vomiting, to follow up for reevaluation.

## 2017-09-17 LAB — URINE CULTURE: CULTURE: NO GROWTH

## 2017-09-17 LAB — CERVICOVAGINAL ANCILLARY ONLY
BACTERIAL VAGINITIS: NEGATIVE
CANDIDA VAGINITIS: NEGATIVE
Chlamydia: NEGATIVE
Neisseria Gonorrhea: NEGATIVE
TRICH (WINDOWPATH): NEGATIVE

## 2017-11-01 ENCOUNTER — Encounter (HOSPITAL_COMMUNITY): Payer: Self-pay | Admitting: Emergency Medicine

## 2017-11-01 ENCOUNTER — Ambulatory Visit (HOSPITAL_COMMUNITY)
Admission: EM | Admit: 2017-11-01 | Discharge: 2017-11-01 | Disposition: A | Discharge status: Home / Self Care | Payer: Medicaid Other | Attending: Family Medicine | Admitting: Family Medicine

## 2017-11-01 DIAGNOSIS — Z0289 Encounter for other administrative examinations: Secondary | ICD-10-CM

## 2017-11-01 DIAGNOSIS — Z7689 Persons encountering health services in other specified circumstances: Secondary | ICD-10-CM | POA: Diagnosis not present

## 2017-11-01 NOTE — ED Provider Notes (Signed)
[facility]    CSN: [id number] Arrival date & time: 11/01/17  9528     History   Chief Complaint Chief Complaint  Patient presents with  . Letter for School/Work    HPI Rebecca Duran is a 22 y.o. female.   22 year old female comes in for evaluation to return to work.  States that she recently came down with "stomach bug" her she had nausea, vomiting, diarrhea.  States this past all throughout her whole family, and was unable to go to work.  States that she has now been symptom-free for 1 day, able to eat and drink today without nausea, vomiting, diarrhea, abdominal pain. Denies fever, chills, night sweats.  Denies other URI symptoms such as cough, congestion, sore throat. Next scheduled shift is in 3 days, work would like her to be examined prior to returning to work.  States she is currently asymptomatic.      Past Medical History:  Diagnosis Date  . Medical history non-contributory     Patient Active Problem List   Diagnosis Date Noted  . Status post vacuum-assisted vaginal delivery 11/23/2015  . Indication for care in labor or delivery 11/21/2015    Past Surgical History:  Procedure Laterality Date  . NO PAST SURGERIES      OB History    Gravida Para Term Preterm AB Living   1 1 1     1    SAB TAB Ectopic Multiple Live Births         0 1       Home Medications    Prior to Admission medications   Medication Sig Start Date End Date Taking? Authorizing Provider  fluconazole (DIFLUCAN) 150 MG tablet Take 1 tablet (150 mg total) by mouth daily. Take second dose 72 hours later if symptoms still persists. Patient not taking: Reported on 11/01/2017 09/16/17   Belinda Fisher, PA-C  ibuprofen (ADVIL,MOTRIN) 600 MG tablet Take 1 tablet (600 mg total) by mouth every 6 (six) hours. 11/23/15   Raliegh Ip, DO  norethindrone (ORTHO MICRONOR) 0.35 MG tablet Take 1 tablet (0.35 mg total) by mouth daily. Patient not taking: Reported on 11/01/2017 11/23/15    Raliegh Ip, DO  phenazopyridine (PYRIDIUM) 200 MG tablet Take 1 tablet (200 mg total) by mouth 3 (three) times daily. Patient not taking: Reported on 11/01/2017 09/05/17   Coralyn Mark, NP  Prenat w/o A Vit-FeFum-FePo-FA (CONCEPT OB) 130-92.4-1 MG CAPS Take 1 tablet by mouth daily. Patient not taking: Reported on 11/01/2017 03/29/15   Dorathy Kinsman, CNM    Family History Family History  Problem Relation Age of Onset  . Cancer Neg Hx   . Diabetes Neg Hx   . Hypertension Neg Hx     Social History Social History   Tobacco Use  . Smoking status: Never Smoker  . Smokeless tobacco: Never Used  Substance Use Topics  . Alcohol use: No  . Drug use: No     Allergies   Patient has no known allergies.   Review of Systems Review of Systems  Reason unable to perform ROS: See HPI as above.     Physical Exam Triage Vital Signs ED Triage Vitals [11/01/17 1957]  Enc Vitals Group     BP 122/72     Pulse Rate 85     Resp 18     Temp 98.1 F (36.7 C)     Temp src      SpO2 100 %  Weight      Height      Head Circumference      Peak Flow      Pain Score 0     Pain Loc      Pain Edu?      Excl. in GC?    No data found.  Updated Vital Signs BP 122/72   Pulse 85   Temp 98.1 F (36.7 C)   Resp 18   LMP 10/25/2017   SpO2 100%   Physical Exam  Constitutional: She is oriented to person, place, and time. She appears well-developed and well-nourished. No distress.  HENT:  Head: Normocephalic and atraumatic.  Eyes: Conjunctivae are normal. Pupils are equal, round, and reactive to light.  Cardiovascular: Normal rate, regular rhythm and normal heart sounds. Exam reveals no gallop and no friction rub.  No murmur heard. Pulmonary/Chest: Effort normal and breath sounds normal. She has no wheezes. She has no rales.  Abdominal: Soft. Bowel sounds are normal. She exhibits no mass. There is no tenderness. There is no rebound, no guarding and no CVA tenderness.    Neurological: She is alert and oriented to person, place, and time.  Skin: Skin is warm and dry.  Psychiatric: She has a normal mood and affect. Her behavior is normal. Judgment normal.     UC Treatments / Results  Labs (all labs ordered are listed, but only abnormal results are displayed) Labs Reviewed - No data to display  EKG  EKG Interpretation None       Radiology No results found.  Procedures Procedures (including critical care time)  Medications Ordered in UC Medications - No data to display   Initial Impression / Assessment and Plan / UC Course  I have reviewed the triage vital signs and the nursing notes.  Pertinent labs & imaging results that were available during my care of the patient were reviewed by me and considered in my medical decision making (see chart for details).    Patient with normal exam.  Given she is symptom-free, without nausea, vomiting, diarrhea, abdominal pain.  Without fever, chills, night sweats.  Okay to return to work on Monday, as long as no new symptoms.  Work note provided.  Recheck as needed.  Final Clinical Impressions(s) / UC Diagnoses   Final diagnoses:  Return to work evaluation    ED Discharge Orders    None        Lurline IdolYu, Amy V, PA-C 11/01/17 2125

## 2017-11-01 NOTE — ED Triage Notes (Signed)
Pt states she had a stomach bug over the week and she feels better now but her job needs a note that she is okay to work.

## 2017-11-01 NOTE — Discharge Instructions (Signed)
Normal exam without fever. Keep hydrated, your urine should be clear to pale yellow in color. Continue bland diet, and advance as tolerated. As long as symptoms not returning/worsens, having fevers, ok to return to work.

## 2018-08-29 ENCOUNTER — Ambulatory Visit (HOSPITAL_COMMUNITY)
Admission: EM | Admit: 2018-08-29 | Discharge: 2018-08-29 | Disposition: A | Discharge status: Home / Self Care | Payer: Medicaid Other | Attending: Family Medicine | Admitting: Family Medicine

## 2018-08-29 ENCOUNTER — Encounter (HOSPITAL_COMMUNITY): Payer: Self-pay

## 2018-08-29 DIAGNOSIS — R109 Unspecified abdominal pain: Secondary | ICD-10-CM | POA: Insufficient documentation

## 2018-08-29 DIAGNOSIS — R350 Frequency of micturition: Secondary | ICD-10-CM | POA: Insufficient documentation

## 2018-08-29 LAB — POCT URINALYSIS DIP (DEVICE)
Bilirubin Urine: NEGATIVE
Glucose, UA: NEGATIVE mg/dL
HGB URINE DIPSTICK: NEGATIVE
Ketones, ur: NEGATIVE mg/dL
Leukocytes, UA: NEGATIVE
NITRITE: NEGATIVE
PH: 6.5 (ref 5.0–8.0)
PROTEIN: NEGATIVE mg/dL
Specific Gravity, Urine: 1.025 (ref 1.005–1.030)
UROBILINOGEN UA: 0.2 mg/dL (ref 0.0–1.0)

## 2018-08-29 MED ORDER — POLYETHYLENE GLYCOL 3350 17 G PO PACK: 17.0000 g | PACK | Freq: Every day | ORAL | 0 refills | Status: DC

## 2018-08-29 NOTE — Discharge Instructions (Signed)
Your urine was negative for infection. Start miralax as directed to help move your bowels. Keep hydrated, urine should be clear to pale yellow in color. If experiencing worsening abdominal pain, nausea/vomiting, fever, unwilling to jump up and down due to pain, go to the emergency department for further evaluation needed.

## 2018-08-29 NOTE — ED Triage Notes (Signed)
Pt presents with urinary tract symptoms X 2 weeks; pt states she is having urinary frequency and discomfort when urinating.

## 2018-08-29 NOTE — ED Provider Notes (Signed)
[facility]    CSN: [id number] Arrival date & time: 08/29/18  1714     History   Chief Complaint Chief Complaint  Patient presents with  . Urinary Tract Infection    HPI Rebecca Duran is a 23 y.o. female.   23 year old female comes in for 2-week history of abdominal cramping and urinary symptoms.  She has had suprapubic abdominal cramping that is intermittent, worse with urination.  She has also had urinary frequency without obvious dysuria, hematuria.  Denies nausea, vomiting.  Denies fever, chills, night sweats.  Denies flank/back pain.  No abnormal vaginal discharge.  Denies itching, bleeding, spotting.  Sexually active with one female partner, no condom use.  LMP 08/18/2018.  She has constipation with last bowel movement this morning that required straining.     Past Medical History:  Diagnosis Date  . Medical history non-contributory     Patient Active Problem List   Diagnosis Date Noted  . Status post vacuum-assisted vaginal delivery 11/23/2015  . Indication for care in labor or delivery 11/21/2015    Past Surgical History:  Procedure Laterality Date  . NO PAST SURGERIES      OB History    Gravida  1   Para  1   Term  1   Preterm      AB      Living  1     SAB      TAB      Ectopic      Multiple  0   Live Births  1            Home Medications    Prior to Admission medications   Medication Sig Start Date End Date Taking? Authorizing Provider  ibuprofen (ADVIL,MOTRIN) 600 MG tablet Take 1 tablet (600 mg total) by mouth every 6 (six) hours. 11/23/15   Raliegh Ip, DO  norethindrone (ORTHO MICRONOR) 0.35 MG tablet Take 1 tablet (0.35 mg total) by mouth daily. Patient not taking: Reported on 11/01/2017 11/23/15   Raliegh Ip, DO  polyethylene glycol [facility]) packet Take 17 g by mouth daily. 08/29/18   Cathie Hoops, Laura Radilla V, PA-C  Prenat w/o A Vit-FeFum-FePo-FA (CONCEPT OB) 130-92.4-1 MG CAPS Take 1 tablet by mouth daily. Patient  not taking: Reported on 11/01/2017 03/29/15   Dorathy Kinsman, CNM    Family History Family History  Problem Relation Age of Onset  . Cancer Neg Hx   . Diabetes Neg Hx   . Hypertension Neg Hx     Social History Social History   Tobacco Use  . Smoking status: Never Smoker  . Smokeless tobacco: Never Used  Substance Use Topics  . Alcohol use: No  . Drug use: No     Allergies   Patient has no known allergies.   Review of Systems Review of Systems  Reason unable to perform ROS: See HPI as above.     Physical Exam Triage Vital Signs ED Triage Vitals  Enc Vitals Group     BP 08/29/18 1827 139/77     Pulse Rate 08/29/18 1827 95     Resp 08/29/18 1827 20     Temp 08/29/18 1827 98.3 F (36.8 C)     Temp Source 08/29/18 1827 Oral     SpO2 08/29/18 1827 100 %     Weight --      Height --      Head Circumference --      Peak Flow --  Pain Score 08/29/18 1829 6     Pain Loc --      Pain Edu? --      Excl. in GC? --    No data found.  Updated Vital Signs BP 139/77 (BP Location: Right Arm)   Pulse 95   Temp 98.3 F (36.8 C) (Oral)   Resp 20   LMP 08/18/2018   SpO2 100%   Physical Exam Constitutional:      General: She is not in acute distress.    Appearance: She is well-developed.  HENT:     Head: Normocephalic and atraumatic.  Eyes:     Conjunctiva/sclera: Conjunctivae normal.     Pupils: Pupils are equal, round, and reactive to light.  Cardiovascular:     Rate and Rhythm: Normal rate and regular rhythm.     Heart sounds: Normal heart sounds. No murmur. No friction rub. No gallop.   Pulmonary:     Effort: Pulmonary effort is normal.     Breath sounds: Normal breath sounds. No wheezing or rales.  Abdominal:     General: Bowel sounds are normal.     Palpations: Abdomen is soft. There is no mass.     Tenderness: There is no abdominal tenderness. There is no guarding or rebound.  Skin:    General: Skin is warm and dry.  Neurological:     Mental  Status: She is alert and oriented to person, place, and time.  Psychiatric:        Behavior: Behavior normal.        Judgment: Judgment normal.      UC Treatments / Results  Labs (all labs ordered are listed, but only abnormal results are displayed) Labs Reviewed  POCT URINALYSIS DIP (DEVICE)    EKG None  Radiology No results found.  Procedures Procedures (including critical care time)  Medications Ordered in UC Medications - No data to display  Initial Impression / Assessment and Plan / UC Course  I have reviewed the triage vital signs and the nursing notes.  Pertinent labs & imaging results that were available during my care of the patient were reviewed by me and considered in my medical decision making (see chart for details).    Urine negative for infection. Will have patient start miralax for possible constipation contributing to symptoms. Push fluids. Return precautions given. Patient expresses understanding and agrees to plan.  Final Clinical Impressions(s) / UC Diagnoses   Final diagnoses:  Abdominal cramping  Urinary frequency    ED Prescriptions    Medication Sig Dispense Auth. Provider   polyethylene glycol (MIRALAX) packet Take 17 g by mouth daily. [address]14 each Threasa AlphaYu, Jacorion Klem V, PA-C        Suvan Stcyr V, PA-C 08/29/18 1911

## 2019-03-25 ENCOUNTER — Other Ambulatory Visit: Payer: Self-pay

## 2019-03-25 ENCOUNTER — Encounter (HOSPITAL_COMMUNITY): Payer: Self-pay

## 2019-03-25 ENCOUNTER — Ambulatory Visit (HOSPITAL_COMMUNITY)
Admission: EM | Admit: 2019-03-25 | Discharge: 2019-03-25 | Disposition: A | Discharge status: Home / Self Care | Payer: Medicaid Other | Attending: Physician Assistant | Admitting: Physician Assistant

## 2019-03-25 DIAGNOSIS — Z3202 Encounter for pregnancy test, result negative: Secondary | ICD-10-CM

## 2019-03-25 DIAGNOSIS — B373 Candidiasis of vulva and vagina: Secondary | ICD-10-CM | POA: Diagnosis not present

## 2019-03-25 DIAGNOSIS — Z113 Encounter for screening for infections with a predominantly sexual mode of transmission: Secondary | ICD-10-CM | POA: Insufficient documentation

## 2019-03-25 DIAGNOSIS — B3731 Acute candidiasis of vulva and vagina: Secondary | ICD-10-CM

## 2019-03-25 LAB — POCT URINALYSIS DIP (DEVICE)
Bilirubin Urine: NEGATIVE
Glucose, UA: NEGATIVE mg/dL
Ketones, ur: NEGATIVE mg/dL
Leukocytes,Ua: NEGATIVE
Nitrite: NEGATIVE
Protein, ur: NEGATIVE mg/dL
Specific Gravity, Urine: 1.025 (ref 1.005–1.030)
Urobilinogen, UA: 0.2 mg/dL (ref 0.0–1.0)
pH: 7 (ref 5.0–8.0)

## 2019-03-25 LAB — POCT PREGNANCY, URINE: Preg Test, Ur: NEGATIVE

## 2019-03-25 MED ORDER — FLUCONAZOLE 150 MG PO TABS: 150.0000 mg | ORAL_TABLET | Freq: Every day | ORAL | 0 refills | Status: AC

## 2019-03-25 NOTE — ED Triage Notes (Signed)
Pt states she has a UTI pt states it's pressure when she void. Pt states she thinks she has Yeast infection as well.

## 2019-03-25 NOTE — ED Provider Notes (Addendum)
[name]    CSN: [id number] Arrival date & time: 03/25/19  1820     History   Chief Complaint Chief Complaint  Patient presents with  . Urinary Tract Infection    HPI Rebecca Duran is a 23 y.o. female.   Patient here concerned with UTI x 3 weeks.  Admits vaginal discharge (thick, white), urinary frequency, denies f/c, n/v/d, abdominal pain, flank pain, dysuria, hematuria, vaginal bleeding.  LMP 1 week ago.  No recent antibiotics use.  Sexually active 1 month ago, with condom use.  No known exposures to STDs, requesting STD testing.  She has not tried any medications to alleviate the symptoms.  The history is provided by the patient.  Urinary Tract Infection Associated symptoms: vaginal discharge   Associated symptoms: no abdominal pain, no fever, no flank pain, no nausea and no vomiting     Past Medical History:  Diagnosis Date  . Medical history non-contributory     Patient Active Problem List   Diagnosis Date Noted  . Status post vacuum-assisted vaginal delivery 11/23/2015  . Indication for care in labor or delivery 11/21/2015    Past Surgical History:  Procedure Laterality Date  . NO PAST SURGERIES      OB History    Gravida  1   Para  1   Term  1   Preterm      AB      Living  1     SAB      TAB      Ectopic      Multiple  0   Live Births  1            Home Medications    Prior to Admission medications   Medication Sig Start Date End Date Taking? Authorizing Provider  fluconazole (DIFLUCAN) 150 MG tablet Take 1 tablet (150 mg total) by mouth daily for 1 day. 03/25/19 03/26/19  Peri Jefferson, PA-C  ibuprofen (ADVIL,MOTRIN) 600 MG tablet Take 1 tablet (600 mg total) by mouth every 6 (six) hours. 11/23/15   Janora Norlander, DO  norethindrone (ORTHO MICRONOR) 0.35 MG tablet Take 1 tablet (0.35 mg total) by mouth daily. Patient not taking: Reported on 11/01/2017 11/23/15   Janora Norlander, DO  polyethylene glycol [facility])  packet Take 17 g by mouth daily. 08/29/18   Tasia Catchings, Amy V, PA-C  Prenat w/o A Vit-FeFum-FePo-FA (CONCEPT OB) 130-92.4-1 MG CAPS Take 1 tablet by mouth daily. Patient not taking: Reported on 11/01/2017 03/29/15   Manya Silvas, CNM    Family History Family History  Problem Relation Age of Onset  . Cancer Neg Hx   . Diabetes Neg Hx   . Hypertension Neg Hx     Social History Social History   Tobacco Use  . Smoking status: Never Smoker  . Smokeless tobacco: Never Used  Substance Use Topics  . Alcohol use: No  . Drug use: No     Allergies   Patient has no known allergies.   Review of Systems Review of Systems  Constitutional: Negative for chills, fatigue and fever.  Gastrointestinal: Negative for abdominal pain, diarrhea, nausea and vomiting.  Genitourinary: Positive for frequency and vaginal discharge. Negative for decreased urine volume, difficulty urinating, dyspareunia, dysuria, flank pain, hematuria, menstrual problem, pelvic pain, urgency and vaginal bleeding.  Musculoskeletal: Negative for arthralgias, back pain and myalgias.  Skin: Negative for color change and rash.       Positive vaginal itching  Neurological: Negative for seizures  and syncope.  Psychiatric/Behavioral: Negative for sleep disturbance. The patient is not nervous/anxious.   All other systems reviewed and are negative.    Physical Exam Triage Vital Signs ED Triage Vitals  Enc Vitals Group     BP 03/25/19 1838 106/65     Pulse Rate 03/25/19 1838 87     Resp 03/25/19 1838 16     Temp 03/25/19 1838 98.5 F (36.9 C)     Temp src --      SpO2 03/25/19 1838 100 %     Weight 03/25/19 1836 140 lb (63.5 kg)     Height --      Head Circumference --      Peak Flow --      Pain Score 03/25/19 1836 0     Pain Loc --      Pain Edu? --      Excl. in GC? --    No data found.  Updated Vital Signs BP 106/65 (BP Location: Right Arm)   Pulse 87   Temp 98.5 F (36.9 C)   Resp 16   Wt 140 lb (63.5 kg)    LMP 03/11/2019   SpO2 100%   BMI 24.03 kg/m   Visual Acuity Right Eye Distance:   Left Eye Distance:   Bilateral Distance:    Right Eye Near:   Left Eye Near:    Bilateral Near:     Physical Exam Vitals signs and nursing note reviewed.  Constitutional:      General: She is not in acute distress.    Appearance: Normal appearance. She is well-developed. She is not ill-appearing.  HENT:     Head: Normocephalic and atraumatic.     Nose: Nose normal.  Eyes:     General: No scleral icterus.    Conjunctiva/sclera: Conjunctivae normal.     Pupils: Pupils are equal, round, and reactive to light.  Neck:     Musculoskeletal: Neck supple.  Cardiovascular:     Rate and Rhythm: Normal rate and regular rhythm.     Pulses: Normal pulses.     Heart sounds: Normal heart sounds. No murmur.  Pulmonary:     Effort: Pulmonary effort is normal. No respiratory distress.     Breath sounds: Normal breath sounds.  Abdominal:     General: Bowel sounds are normal.     Palpations: Abdomen is soft.     Tenderness: There is no abdominal tenderness. There is no right CVA tenderness, left CVA tenderness or guarding.  Genitourinary:    Comments: Declined GU exam Skin:    General: Skin is warm and dry.  Neurological:     General: No focal deficit present.     Mental Status: She is alert and oriented to person, place, and time.  Psychiatric:        Mood and Affect: Mood normal.        Behavior: Behavior normal.      UC Treatments / Results  Labs (all labs ordered are listed, but only abnormal results are displayed) Labs Reviewed  POCT URINALYSIS DIP (DEVICE) - Abnormal; Notable for the following components:      Result Value   Hgb urine dipstick TRACE (*)    All other components within normal limits  POC URINE PREG, ED  POCT PREGNANCY, URINE  CERVICOVAGINAL ANCILLARY ONLY    EKG   Radiology No results found.  Procedures Procedures (including critical care time)  Medications  Ordered in UC Medications - No data to  display  Initial Impression / Assessment and Plan / UC Course  I have reviewed the triage vital signs and the nursing notes.  Pertinent labs & imaging results that were available during my care of the patient were reviewed by me and considered in my medical decision making (see chart for details).  Clinical Course as of Mar 24 1937  Wed Mar 25, 2019  [id number] POCT urinalysis dip (device)(!) [JL]  1852 Ketones, ur: NEGATIVE [JL]  1852 Hgb urine dipstick(!): TRACE [JL]    Clinical Course User Index [JL] Evern CoreLindquist, Juliannah Ohmann, PA-C     Final Clinical Impressions(s) / UC Diagnoses   Final diagnoses:  Vaginal candida  Screen for STD (sexually transmitted disease)   Discharge Instructions   None    ED Prescriptions    Medication Sig Dispense Auth. Provider   fluconazole (DIFLUCAN) 150 MG tablet Take 1 tablet (150 mg total) by mouth daily for 1 day. 1 tablet Evern CoreLindquist, Mylo Choi, PA-C     Controlled Substance Prescriptions [place] Controlled Substance Registry consulted? Not Applicable   Evern CoreLindquist, Sabria Florido, PA-C 03/25/19 1938    Evern CoreLindquist, Shianne Zeiser, PA-C 03/25/19 1944

## 2019-03-27 ENCOUNTER — Telehealth (HOSPITAL_COMMUNITY): Payer: Self-pay | Admitting: Emergency Medicine

## 2019-03-27 MED ORDER — FLUCONAZOLE 150 MG PO TABS: 150.0000 mg | ORAL_TABLET | Freq: Once | ORAL | 0 refills | Status: AC

## 2019-03-27 NOTE — Telephone Encounter (Signed)
Pt called asking about test results, results not back, pt c/o ongoing symptoms, was only given 1 diflucan pill prescription, offered repeat dosing, pt will try repeat dose and if not working will call back to see if results are back. Pt agreeable to plan.

## 2019-03-30 ENCOUNTER — Telehealth (HOSPITAL_COMMUNITY): Payer: Self-pay | Admitting: Emergency Medicine

## 2019-03-30 LAB — CERVICOVAGINAL ANCILLARY ONLY
Bacterial vaginitis: NEGATIVE
Candida vaginitis: NEGATIVE
Chlamydia: NEGATIVE
Neisseria Gonorrhea: NEGATIVE
Trichomonas: NEGATIVE

## 2019-03-30 MED ORDER — METRONIDAZOLE 500 MG PO TABS: 500.0000 mg | ORAL_TABLET | Freq: Two times a day (BID) | ORAL | 0 refills | Status: AC

## 2019-03-30 NOTE — Telephone Encounter (Signed)
Pt called asking about lab results. Lab results not back. Pt requesting medication for BV, okay to send per Dr. Meda Coffee.

## 2019-04-15 ENCOUNTER — Ambulatory Visit (HOSPITAL_COMMUNITY)
Admission: EM | Admit: 2019-04-15 | Discharge: 2019-04-15 | Disposition: A | Discharge status: Home / Self Care | Payer: Medicaid Other | Attending: Family Medicine | Admitting: Family Medicine

## 2019-04-15 ENCOUNTER — Encounter (HOSPITAL_COMMUNITY): Payer: Self-pay

## 2019-04-15 ENCOUNTER — Other Ambulatory Visit: Payer: Self-pay

## 2019-04-15 DIAGNOSIS — B379 Candidiasis, unspecified: Secondary | ICD-10-CM

## 2019-04-15 MED ORDER — NYSTATIN 100000 UNIT/GM EX CREA: TOPICAL_CREAM | CUTANEOUS | 0 refills | Status: DC

## 2019-04-15 MED ORDER — TERCONAZOLE 80 MG VA SUPP: 80.0000 mg | Freq: Every day | VAGINAL | 0 refills | Status: DC

## 2019-04-15 NOTE — Discharge Instructions (Addendum)
Take the medication as prescribed for yeast infection. For any continued or worsening problems you need to follow-up with OB/GYN.

## 2019-04-15 NOTE — ED Triage Notes (Addendum)
Pt states she thinks she has a yeast infection again. Pt states she not sure what's going on she just had this on 03/25/19. This has been going on a week now. Pt states its a vaginal itch and burning with a white discharge.

## 2019-04-15 NOTE — ED Provider Notes (Signed)
[place]    CSN: [id number] Arrival date & time: 04/15/19  1104      History   Chief Complaint Chief Complaint  Patient presents with  . Vaginal Discharge    HPI Malayjah Otoole is a 23 y.o. female.   Patient is a 23 year old female the presents today with continued and worsening vaginal discharge.  She was seen here on 03/25/2019 and treated for a yeast infection with Diflucan.  Reports of some some somewhat improved with this but returned and worsened.  Describing is thick white, vaginal discharge and some discomfort in the vaginal area.  She is having itching and burning.  This episode has been x1 week.  Reporting that in the past she has had more luck with vaginal suppository treatment versus oral treatment for her yeast infections.  She has not been sexually active in over a month and has recently been tested for STDs with negative results.  No abdominal pain, back pain or fevers.  No dysuria, hematuria urinary frequency.  ROS per HPI      Past Medical History:  Diagnosis Date  . Medical history non-contributory     Patient Active Problem List   Diagnosis Date Noted  . Status post vacuum-assisted vaginal delivery 11/23/2015  . Indication for care in labor or delivery 11/21/2015    Past Surgical History:  Procedure Laterality Date  . NO PAST SURGERIES      OB History    Gravida  1   Para  1   Term  1   Preterm      AB      Living  1     SAB      TAB      Ectopic      Multiple  0   Live Births  1            Home Medications    Prior to Admission medications   Medication Sig Start Date End Date Taking? Authorizing Provider  ibuprofen (ADVIL,MOTRIN) 600 MG tablet Take 1 tablet (600 mg total) by mouth every 6 (six) hours. 11/23/15   Janora Norlander, DO  norethindrone (ORTHO MICRONOR) 0.35 MG tablet Take 1 tablet (0.35 mg total) by mouth daily. Patient not taking: Reported on 11/01/2017 11/23/15   Janora Norlander, DO   nystatin cream (MYCOSTATIN) Apply to affected area 2 times daily 04/15/19   Loura Halt A, NP  polyethylene glycol (MIRALAX) packet Take 17 g by mouth daily. 08/29/18   Tasia Catchings, Amy V, PA-C  Prenat w/o A Vit-FeFum-FePo-FA (CONCEPT OB) 130-92.4-1 MG CAPS Take 1 tablet by mouth daily. Patient not taking: Reported on 11/01/2017 03/29/15   Tamala Julian, [place], CNM  terconazole (TERAZOL 3) 80 MG vaginal suppository Place 1 suppository (80 mg total) vaginally at bedtime. 04/15/19   Orvan July, NP    Family History Family History  Problem Relation Age of Onset  . Cancer Neg Hx   . Diabetes Neg Hx   . Hypertension Neg Hx     Social History Social History   Tobacco Use  . Smoking status: Never Smoker  . Smokeless tobacco: Never Used  Substance Use Topics  . Alcohol use: No  . Drug use: No     Allergies   Patient has no known allergies.   Review of Systems Review of Systems   Physical Exam Triage Vital Signs ED Triage Vitals  Enc Vitals Group     BP 04/15/19 1122 117/79  Pulse Rate 04/15/19 1122 88     Resp 04/15/19 1122 18     Temp 04/15/19 1122 97.9 F (36.6 C)     Temp Source 04/15/19 1122 Oral     SpO2 04/15/19 1122 100 %     Weight 04/15/19 1123 137 lb (62.1 kg)     Height --      Head Circumference --      Peak Flow --      Pain Score 04/15/19 1123 5     Pain Loc --      Pain Edu? --      Excl. in GC? --    No data found.  Updated Vital Signs BP 117/79 (BP Location: Right Arm)   Pulse 88   Temp 97.9 F (36.6 C) (Oral)   Resp 18   Wt 137 lb (62.1 kg)   LMP 04/09/2019   SpO2 100%   BMI 23.52 kg/m   Visual Acuity Right Eye Distance:   Left Eye Distance:   Bilateral Distance:    Right Eye Near:   Left Eye Near:    Bilateral Near:     Physical Exam Vitals signs and nursing note reviewed.  Constitutional:      General: She is not in acute distress.    Appearance: Normal appearance. She is not ill-appearing, toxic-appearing or diaphoretic.  HENT:      Head: Normocephalic and atraumatic.     Nose: Nose normal.     Mouth/Throat:     Pharynx: Oropharynx is clear.  Eyes:     Conjunctiva/sclera: Conjunctivae normal.  Neck:     Musculoskeletal: Normal range of motion.  Pulmonary:     Effort: Pulmonary effort is normal.  Abdominal:     Palpations: Abdomen is soft.     Tenderness: There is no abdominal tenderness.  Genitourinary:    Vagina: Vaginal discharge and erythema present. No tenderness, bleeding or lesions.     Cervix: No cervical motion tenderness, lesion or cervical bleeding.     Comments: External vaginal exam without lesions.  Noted labia majora and erythematous.  White, thick, chunky discharge noted around vaginal opening.  Same discharge noted in vaginal vault. No CMT  Musculoskeletal: Normal range of motion.  Skin:    General: Skin is warm and dry.     Findings: No rash.  Neurological:     Mental Status: She is alert.  Psychiatric:        Mood and Affect: Mood normal.      UC Treatments / Results  Labs (all labs ordered are listed, but only abnormal results are displayed) Labs Reviewed - No data to display  EKG   Radiology No results found.  Procedures Procedures (including critical care time)  Medications Ordered in UC Medications - No data to display  Initial Impression / Assessment and Plan / UC Course  I have reviewed the triage vital signs and the nursing notes.  Pertinent labs & imaging results that were available during my care of the patient were reviewed by me and considered in my medical decision making (see chart for details).     Symptoms and exam consistent with moderate to severe yeast infection. Patient reporting that her yeast infections in the past have been treated better with suppositories. We will treat with terconazole suppositories and nystatin cream She just finished course of Diflucan a week or more ago without much relief. Symptoms have worsened.  No concern for STDs.   Recently tested with negative results.  She has not been sexually active in over a month. No concern for PID. Final Clinical Impressions(s) / UC Diagnoses   Final diagnoses:  Yeast infection     Discharge Instructions     Take the medication as prescribed for yeast infection. For any continued or worsening problems you need to follow-up with OB/GYN.    ED Prescriptions    Medication Sig Dispense Auth. Provider   terconazole (TERAZOL 3) 80 MG vaginal suppository Place 1 suppository (80 mg total) vaginally at bedtime. 3 suppository Jaja Switalski A, NP   nystatin cream (MYCOSTATIN) Apply to affected area 2 times daily 30 g Dahlia ByesBast, Atsushi Yom A, NP     Controlled Substance Prescriptions [place] Controlled Substance Registry consulted? Not Applicable   Janace ArisBast, Elaura Calix A, NP 04/15/19 1312

## 2019-11-11 ENCOUNTER — Emergency Department (HOSPITAL_COMMUNITY)
Admission: EM | Admit: 2019-11-11 | Discharge: 2019-11-12 | Disposition: A | Discharge status: Home / Self Care | Payer: Medicaid Other | Attending: Emergency Medicine | Admitting: Emergency Medicine

## 2019-11-11 ENCOUNTER — Encounter (HOSPITAL_COMMUNITY): Payer: Self-pay | Admitting: Emergency Medicine

## 2019-11-11 DIAGNOSIS — N898 Other specified noninflammatory disorders of vagina: Secondary | ICD-10-CM | POA: Insufficient documentation

## 2019-11-11 DIAGNOSIS — Z79899 Other long term (current) drug therapy: Secondary | ICD-10-CM | POA: Diagnosis not present

## 2019-11-11 DIAGNOSIS — N939 Abnormal uterine and vaginal bleeding, unspecified: Secondary | ICD-10-CM | POA: Diagnosis not present

## 2019-11-11 LAB — CBC
HCT: 42.3 % (ref 36.0–46.0)
Hemoglobin: 13.4 g/dL (ref 12.0–15.0)
MCH: 27.9 pg (ref 26.0–34.0)
MCHC: 31.7 g/dL (ref 30.0–36.0)
MCV: 87.9 fL (ref 80.0–100.0)
Platelets: 397 10*3/uL (ref 150–400)
RBC: 4.81 MIL/uL (ref 3.87–5.11)
RDW: 12.4 % (ref 11.5–15.5)
WBC: 12 10*3/uL — ABNORMAL HIGH (ref 4.0–10.5)
nRBC: 0 % (ref 0.0–0.2)

## 2019-11-11 LAB — I-STAT BETA HCG BLOOD, ED (MC, WL, AP ONLY): I-stat hCG, quantitative: <5 m[IU]/mL (ref <5)

## 2019-11-11 NOTE — ED Triage Notes (Signed)
Pt in POV, reports heavy vaginal bleeding since Saturday. Initially thought it was her period, but states they are normally not this heavy.

## 2019-11-12 ENCOUNTER — Other Ambulatory Visit: Payer: Self-pay

## 2019-11-12 MED ORDER — MEGESTROL ACETATE 20 MG PO TABS
20.0000 mg | ORAL_TABLET | Freq: Every day | ORAL | 0 refills | Status: AC
Start: 2019-11-12 — End: 2019-11-22

## 2019-11-12 NOTE — ED Provider Notes (Signed)
[facility] EMERGENCY DEPARTMENT Provider Note   CSN: [id number] Arrival date & time: 11/11/19  2002     History Chief Complaint  Patient presents with  . Vaginal Bleeding    Rebecca Duran is a 24 y.o. female.  24 year old female presents with complaint of heavy vaginal bleeding.  Patient reports LMP of February 26, started having brown mucousy discharge almost 1 week ago, states that she felt lightheaded at that time but not since.  Patient states heavy bleeding started on Monday reports using 6 light pads daily.  Patient states that she is never had a heavy menstrual cycle like this.  Denies abdominal pain or cramping.  No other complaints or concerns.        Past Medical History:  Diagnosis Date  . Medical history non-contributory     Patient Active Problem List   Diagnosis Date Noted  . Status post vacuum-assisted vaginal delivery 11/23/2015  . Indication for care in labor or delivery 11/21/2015    Past Surgical History:  Procedure Laterality Date  . NO PAST SURGERIES       OB History    Gravida  1   Para  1   Term  1   Preterm      AB      Living  1     SAB      TAB      Ectopic      Multiple  0   Live Births  1           Family History  Problem Relation Age of Onset  . Cancer Neg Hx   . Diabetes Neg Hx   . Hypertension Neg Hx     Social History   Tobacco Use  . Smoking status: Never Smoker  . Smokeless tobacco: Never Used  Substance Use Topics  . Alcohol use: No  . Drug use: No    Home Medications Prior to Admission medications   Medication Sig Start Date End Date Taking? Authorizing Provider  ibuprofen (ADVIL,MOTRIN) 600 MG tablet Take 1 tablet (600 mg total) by mouth every 6 (six) hours. Patient not taking: Reported on 11/12/2019 11/23/15   Janora Norlander, DO  megestrol (MEGACE) 20 MG tablet Take 1 tablet (20 mg total) by mouth daily for 10 days. 11/12/19 11/22/19  Tacy Learn, PA-C  norethindrone  (ORTHO MICRONOR) 0.35 MG tablet Take 1 tablet (0.35 mg total) by mouth daily. Patient not taking: Reported on 11/01/2017 11/23/15   Janora Norlander, DO  nystatin cream (MYCOSTATIN) Apply to affected area 2 times daily Patient not taking: Reported on 11/12/2019 04/15/19   Loura Halt A, NP  polyethylene glycol (MIRALAX) packet Take 17 g by mouth daily. Patient not taking: Reported on 11/12/2019 08/29/18   Ok Edwards, PA-C  Prenat w/o A Vit-FeFum-FePo-FA (CONCEPT OB) 130-92.4-1 MG CAPS Take 1 tablet by mouth daily. Patient not taking: Reported on 11/01/2017 03/29/15   Tamala Julian, [place], CNM  terconazole (TERAZOL 3) 80 MG vaginal suppository Place 1 suppository (80 mg total) vaginally at bedtime. Patient not taking: Reported on 11/12/2019 04/15/19   Orvan July, NP    Allergies    Patient has no known allergies.  Review of Systems   Review of Systems  Constitutional: Negative for fever.  Respiratory: Negative for shortness of breath.   Gastrointestinal: Negative for abdominal pain.  Genitourinary: Positive for vaginal bleeding. Negative for pelvic pain.  Musculoskeletal: Negative for back pain and myalgias.  Skin:  Negative for rash and wound.  Allergic/Immunologic: Negative for immunocompromised state.  Neurological: Negative for weakness.  Hematological: Negative for adenopathy.  Psychiatric/Behavioral: Negative for confusion.    Physical Exam Updated Vital Signs BP 117/79 (BP Location: Left Arm)   Pulse 81   Temp 98.7 F (37.1 C) (Oral)   Resp 18   Ht 5\' 4"  (1.626 m)   Wt 65.8 kg   SpO2 99%   BMI 24.89 kg/m   Physical Exam Vitals and nursing note reviewed. Exam conducted with a chaperone present.  Constitutional:      General: She is not in acute distress.    Appearance: She is well-developed. She is not diaphoretic.  HENT:     Head: Normocephalic and atraumatic.  Cardiovascular:     Rate and Rhythm: Normal rate and regular rhythm.     Pulses: Normal pulses.     Heart  sounds: Normal heart sounds.  Pulmonary:     Effort: Pulmonary effort is normal.     Breath sounds: Normal breath sounds.  Abdominal:     Palpations: Abdomen is soft.     Tenderness: There is no abdominal tenderness. There is no right CVA tenderness or left CVA tenderness.  Genitourinary:    Vagina: Bleeding present.     Comments: Moderate amount of blood in vaginal vault, no significant bleeding from os. Skin:    General: Skin is warm and dry.     Findings: No erythema or rash.  Neurological:     Mental Status: She is alert and oriented to person, place, and time.  Psychiatric:        Behavior: Behavior normal.     ED Results / Procedures / Treatments   Labs (all labs ordered are listed, but only abnormal results are displayed) Labs Reviewed  CBC - Abnormal; Notable for the following components:      Result Value   WBC 12.0 (*)    All other components within normal limits  I-STAT BETA HCG BLOOD, ED ([facility], [facility], [facility] ONLY)    EKG None  Radiology No results found.  Procedures Procedures (including critical care time)  Medications Ordered in ED Medications - No data to display  ED Course  I have reviewed the triage vital signs and the nursing notes.  Pertinent labs & imaging results that were available during my care of the patient were reviewed by me and considered in my medical decision making (see chart for details).  Clinical Course as of Nov 11 825  Thu Nov 12, 2019  0826 23yo female with complaint of heavy vaginal bleeding x 3 days. Hemodynamically stable, vitals stable/normal, hcg negative. No significant active bleeding on exam, no clotting history, will dc with 10 day course of Megace with plan to follow up with the women's clinic, return to ER for new or concerning symptoms.    [LM]    Clinical Course User Index [LM] Nov 14, 2019   MDM Rules/Calculators/A&P                      Final Clinical Impression(s) / ED Diagnoses Final diagnoses:    Vaginal bleeding    Rx / DC Orders ED Discharge Orders         Ordered    megestrol (MEGACE) 20 MG tablet  Daily     11/12/19 0822           11/14/19, PA-C 11/12/19 0827    11/14/19, MD 11/12/19  1903  

## 2019-11-12 NOTE — Discharge Instructions (Addendum)
Take Megace as prescribed and follow up with GYN/womens' clinic, information listed below.  Return to ER for worsening or concerning symptoms.

## 2019-11-13 ENCOUNTER — Telehealth: Payer: Self-pay

## 2019-11-13 ENCOUNTER — Ambulatory Visit: Payer: Self-pay | Admitting: *Deleted

## 2019-11-13 NOTE — Telephone Encounter (Signed)
Pt called with having vaginal bleeding that started out as a brownish discharge for 2 days and  to heavy bleeding for the last couple of days and today. She went to the ED on the 24 th and was given Megace to take. She started taking it today. No fever. She has continue to bleed. She has a new patient appointment with an OB-GYN. Advised to drink plenty of fluids, get her rest and get up slowly. If she starts having heavier bleeding with large clots, feeling faint, abd pain or fever to go to the ED. She voiced understanding.     Answer Assessment - Initial Assessment Questions 1. AMOUNT: "Describe the bleeding that you are having."    - SPOTTING: spotting, or pinkish / brownish mucous discharge; does not fill panti-liner or pad    - MILD:  less than 1 pad / hour; less than patient's usual menstrual bleeding   - MODERATE: 1-2 pads / hour; 1 menstrual cup every 6 hours; small-medium blood clots (e.g., pea, grape, small coin)   - SEVERE: soaking 2 or more pads/hour for 2 or more hours; 1 menstrual cup every 2 hours; bleeding not contained by pads or continuous red blood from vagina; large blood clots (e.g., golf ball, large coin)      Moderate now 2. ONSET: "When did the bleeding begin?" "Is it continuing now?"     Over the weekend and gradually gotten worst and now better 3. MENSTRUAL PERIOD: "When was the last normal menstrual period?" "How is this different than your period?"     Last month 4. REGULARITY: "How regular are your periods?"     regular 5. ABDOMINAL PAIN: "Do you have any pain?" "How bad is the pain?"  (e.g., Scale 1-10; mild, moderate, or severe)   - MILD (1-3): doesn't interfere with normal activities, abdomen soft and not tender to touch    - MODERATE (4-7): interferes with normal activities or awakens from sleep, tender to touch    - SEVERE (8-10): excruciating pain, doubled over, unable to do any normal activities      Had some cramping this morning 6. PREGNANCY: "Could  you be pregnant?" "Are you sexually active?" "Did you recently give birth?"     Is sexually active and gave birth in 2017 7. BREASTFEEDING: "Are you breastfeeding?"     n/a 8. HORMONES: "Are you taking any hormone medications, prescription or OTC?" (e.g., birth control pills, estrogen)     no 9. BLOOD THINNERS: "Do you take any blood thinners?" (e.g., Coumadin/warfarin, Pradaxa/dabigatran, aspirin)     no 10. CAUSE: "What do you think is causing the bleeding?" (e.g., recent gyn surgery, recent gyn procedure; known bleeding disorder, cervical cancer, polycystic ovarian disease, fibroids)         no 11. HEMODYNAMIC STATUS: "Are you weak or feeling lightheaded?" If so, ask: "Can you stand and walk normally?"        Not today but did on the weekend 12. OTHER SYMPTOMS: "What other symptoms are you having with the bleeding?" (e.g., passed tissue, vaginal discharge, fever, menstrual-type cramps)       none  Protocols used: VAGINAL BLEEDING - ABNORMAL-A-AH

## 2019-11-13 NOTE — Telephone Encounter (Signed)
Patient seen in ER yesterday. Has been having abnormal bleeding since Saturday. She was given pills to stop it, but she's still bleeding. [phone]

## 2019-11-13 NOTE — Telephone Encounter (Signed)
Spoke w/patient. Advised unable to give advice as she is not an established patient. Patient would like to establish care. Transferred to front desk for apt scheduling.

## 2019-11-18 ENCOUNTER — Encounter: Payer: Self-pay | Admitting: Obstetrics and Gynecology

## 2019-11-18 ENCOUNTER — Ambulatory Visit (INDEPENDENT_AMBULATORY_CARE_PROVIDER_SITE_OTHER): Payer: Medicaid Other | Admitting: Obstetrics and Gynecology

## 2019-11-18 ENCOUNTER — Other Ambulatory Visit: Payer: Self-pay

## 2019-11-18 ENCOUNTER — Other Ambulatory Visit (HOSPITAL_COMMUNITY)
Admission: RE | Admit: 2019-11-18 | Discharge: 2019-11-18 | Disposition: A | Discharge status: Home / Self Care | Payer: Medicaid Other | Source: Ambulatory Visit | Attending: Obstetrics and Gynecology | Admitting: Obstetrics and Gynecology

## 2019-11-18 VITALS — BP 131/83 | HR 108 | Ht 64.5 in | Wt 144.0 lb

## 2019-11-18 DIAGNOSIS — Z124 Encounter for screening for malignant neoplasm of cervix: Secondary | ICD-10-CM | POA: Insufficient documentation

## 2019-11-18 DIAGNOSIS — N921 Excessive and frequent menstruation with irregular cycle: Secondary | ICD-10-CM | POA: Insufficient documentation

## 2019-11-18 DIAGNOSIS — N730 Acute parametritis and pelvic cellulitis: Secondary | ICD-10-CM | POA: Insufficient documentation

## 2019-11-18 MED ORDER — DOXYCYCLINE HYCLATE 100 MG PO CAPS: 100.0000 mg | ORAL_CAPSULE | Freq: Two times a day (BID) | ORAL | 0 refills | Status: AC

## 2019-11-18 MED ORDER — CEFTRIAXONE SODIUM 500 MG IJ SOLR
250.0000 mg | Freq: Once | INTRAMUSCULAR | Status: AC
  Administered 2019-11-18: 250 mg via INTRAMUSCULAR

## 2019-11-18 NOTE — Progress Notes (Signed)
Obstetrics & Gynecology Office Visit    Chief Complaint  Patient presents with  . ER follow up    Heavy bleeding w/clots, cramping   History of Present Illness: 24 y.o. G19P1001 female who presents in follow up from an ER visit on 11/12/2019 for heavy vaginal bleeding.  She was discharged on Megace with normal CBC and negative pregnancy testing. No imaging performed.  She was discharged on megestrol 20 mg daily.  Prior to this episode, her periods would come monthly, lasting 4-5 days, light, no painful.  She has not been on any form of birth control.  She has never had this occur before.  She had no event occur, like intercourse, that might've caused this.  She has never had a pap smear.  She has a history of chlamydia about 3-4 years ago.  This was treated.  She was tested 7 months ago for STDs and this was negative.  The bleeding started on 11/07/19. Her regular period was supposed to start on 3/28.  The period prior to that was the end of February until March 3-4.  She is having cramping and sharp pain in her lower back. She also has sharp pain on her left side. She denies fevers and chills.  She notes that early after starting this bleeding, she got up from lying down and had a fast heart rate, her lips were blue. She had to lie back down in order for the dizzyness to stop.  She denies nausea, emesis, diarrhea, constipation.  She does note some nausea after eating, but she doesn't throw up.   Past Medical History:  Diagnosis Date  . Medical history non-contributory     Past Surgical History:  Procedure Laterality Date  . NO PAST SURGERIES      Gynecologic History: Patient's last menstrual period was 11/07/2019.  Obstetric History: G1P1001, s/p SVD.   Family History  Problem Relation Age of Onset  . Cancer Neg Hx   . Diabetes Neg Hx   . Hypertension Neg Hx     Social History   Socioeconomic History  . Marital status: Single    Spouse name: Not on file  . Number of children:  Not on file  . Years of education: Not on file  . Highest education level: Not on file  Occupational History  . Not on file  Tobacco Use  . Smoking status: Never Smoker  . Smokeless tobacco: Never Used  Substance and Sexual Activity  . Alcohol use: No  . Drug use: No  . Sexual activity: Yes    Birth control/protection: None  Other Topics Concern  . Not on file  Social History Narrative  . Not on file   Social Determinants of Health   Financial Resource Strain:   . Difficulty of Paying Living Expenses:   Food Insecurity:   . Worried About Programme researcher, broadcasting/film/video in the Last Year:   . Barista in the Last Year:   Transportation Needs:   . Freight forwarder (Medical):   Marland Kitchen Lack of Transportation (Non-Medical):   Physical Activity:   . Days of Exercise per Week:   . Minutes of Exercise per Session:   Stress:   . Feeling of Stress :   Social Connections:   . Frequency of Communication with Friends and Family:   . Frequency of Social Gatherings with Friends and Family:   . Attends Religious Services:   . Active Member of Clubs or Organizations:   .  Attends Archivist Meetings:   Marland Kitchen Marital Status:   Intimate Partner Violence:   . Fear of Current or Ex-Partner:   . Emotionally Abused:   Marland Kitchen Physically Abused:   . Sexually Abused:    Allergies: No Known Allergies  Prior to Admission medications   Medication Sig Start Date End Date Taking? Authorizing Provider  megestrol (MEGACE) 20 MG tablet Take 1 tablet (20 mg total) by mouth daily for 10 days. 11/12/19 11/22/19 Yes Tacy Learn, PA-C    Review of Systems  Constitutional: Negative.   HENT: Negative.   Eyes: Negative.   Respiratory: Negative.   Cardiovascular: Negative.   Gastrointestinal: Negative.   Genitourinary: Negative.   Musculoskeletal: Negative.   Skin: Negative.   Neurological: Negative.   Psychiatric/Behavioral: Negative.      Physical Exam BP 131/83   Pulse (!) 108   Ht 5' 4.5"  (1.638 m)   Wt 144 lb (65.3 kg)   LMP 11/07/2019   BMI 24.34 kg/m  Patient's last menstrual period was 11/07/2019. Physical Exam Constitutional:      General: She is not in acute distress.    Appearance: Normal appearance. She is well-developed.  Genitourinary:     Pelvic exam was performed with patient in the lithotomy position.     Vulva, inguinal canal, urethra, bladder, vagina, uterus and right adnexa normal.     No posterior fourchette tenderness, injury or lesion present.     Vaginal exam comments: Small amount of old blood in vaginal vault.     No cervical motion tenderness, friability, lesion, bleeding or polyp.     Left adnexa tender (mild ttp).  HENT:     Head: Normocephalic and atraumatic.  Eyes:     General: No scleral icterus.    Conjunctiva/sclera: Conjunctivae normal.  Cardiovascular:     Rate and Rhythm: Normal rate and regular rhythm.     Heart sounds: No murmur. No friction rub. No gallop.   Pulmonary:     Effort: Pulmonary effort is normal. No respiratory distress.     Breath sounds: Normal breath sounds. No wheezing or rales.  Abdominal:     General: Bowel sounds are normal. There is no distension.     Palpations: Abdomen is soft. There is no mass.     Tenderness: There is abdominal tenderness (mild LLQ). There is no guarding or rebound.  Musculoskeletal:        General: Normal range of motion.     Cervical back: Normal range of motion and neck supple.  Neurological:     General: No focal deficit present.     Mental Status: She is alert and oriented to person, place, and time.     Cranial Nerves: No cranial nerve deficit.  Skin:    General: Skin is warm and dry.     Findings: No erythema.  Psychiatric:        Mood and Affect: Mood normal.        Behavior: Behavior normal.        Judgment: Judgment normal.     Female chaperone present for pelvic and breast  portions of the physical exam  Assessment: 24 y.o. G105P1001 female here for  1. Menorrhagia  with irregular cycle   2. Pap smear for cervical cancer screening   3. PID (acute pelvic inflammatory disease)      Plan: Problem List Items Addressed This Visit    None    Visit Diagnoses    Menorrhagia with  irregular cycle    -  Primary   Relevant Medications   doxycycline (VIBRAMYCIN) 100 MG capsule   Other Relevant Orders   Cytology - PAP   Cervicovaginal ancillary only   US PELVIS TRANSVAGINAL NON-OB (TV ONLY)   Pap smear for cervical cancer screening       Relevant Orders   Cytology - PAP   PID (acute pelvic inflammatory disease)       Relevant Medications   cefTRIAXone (ROCEPHIN) injection 250 mg (Start on 11/18/2019 12:15 PM)   doxycycline (VIBRAMYCIN) 100 MG capsule   Other Relevant Orders   Cervicovaginal ancillary only     Concern for PID. Will treat accordingly. Will get pelvic u/s when able, if other etiology.   A total of 40 minutes were spent face-to-face with the patient as well as preparation, review, communication, and documentation during this encounter.    Thomasene Mohair, MD 11/18/2019 12:13 PM

## 2019-11-19 ENCOUNTER — Other Ambulatory Visit (INDEPENDENT_AMBULATORY_CARE_PROVIDER_SITE_OTHER): Payer: Medicaid Other

## 2019-11-19 DIAGNOSIS — N938 Other specified abnormal uterine and vaginal bleeding: Secondary | ICD-10-CM

## 2019-11-19 DIAGNOSIS — N921 Excessive and frequent menstruation with irregular cycle: Secondary | ICD-10-CM | POA: Diagnosis not present

## 2019-11-19 LAB — CYTOLOGY - PAP: Diagnosis: NEGATIVE

## 2019-11-19 LAB — CERVICOVAGINAL ANCILLARY ONLY
Chlamydia: NEGATIVE
Comment: NEGATIVE
Comment: NEGATIVE
Comment: NORMAL
Neisseria Gonorrhea: NEGATIVE
Trichomonas: NEGATIVE

## 2019-11-25 ENCOUNTER — Ambulatory Visit (INDEPENDENT_AMBULATORY_CARE_PROVIDER_SITE_OTHER): Payer: Medicaid Other | Admitting: Obstetrics and Gynecology

## 2019-11-25 ENCOUNTER — Encounter: Payer: Self-pay | Admitting: Obstetrics and Gynecology

## 2019-11-25 ENCOUNTER — Other Ambulatory Visit: Payer: Self-pay

## 2019-11-25 VITALS — BP 122/74 | Ht 64.0 in | Wt 143.0 lb

## 2019-11-25 DIAGNOSIS — N921 Excessive and frequent menstruation with irregular cycle: Secondary | ICD-10-CM

## 2019-11-25 NOTE — Progress Notes (Signed)
Gynecology Ultrasound Follow Up   Chief Complaint  Patient presents with  . Follow-up  Ultrasound on 11/19/19 for abnormal uterine bleeding   History of Present Illness: Patient is a 24 y.o. female who presents today for ultrasound evaluation of the above issue.  Ultrasound demonstrates the following findings Adnexa: no masses seen  Uterus: retroverted with endometrial stripe  4.5 mm Additional: no abnormal findings.  Pap smear normal, STD screen negative.  At her last appointment she was treated for pelvic inflammatory disease.  She received the ceftriaxone injection of 250 mg intramuscularly in the clinic.  She was given a prescription for doxycycline, which she did not take, per her report today. She has continued to have somewhat abnormal bleeding with some times darker blood and sometimes lighter blood.  Past Medical History:  Diagnosis Date  . Medical history non-contributory     Past Surgical History:  Procedure Laterality Date  . NO PAST SURGERIES      Family History  Problem Relation Age of Onset  . Cancer Neg Hx   . Diabetes Neg Hx   . Hypertension Neg Hx     Social History   Socioeconomic History  . Marital status: Single    Spouse name: Not on file  . Number of children: Not on file  . Years of education: Not on file  . Highest education level: Not on file  Occupational History  . Not on file  Tobacco Use  . Smoking status: Never Smoker  . Smokeless tobacco: Never Used  Substance and Sexual Activity  . Alcohol use: No  . Drug use: No  . Sexual activity: Yes    Birth control/protection: None  Other Topics Concern  . Not on file  Social History Narrative  . Not on file   Social Determinants of Health   Financial Resource Strain:   . Difficulty of Paying Living Expenses:   Food Insecurity:   . Worried About Programme researcher, broadcasting/film/video in the Last Year:   . Barista in the Last Year:   Transportation Needs:   . Freight forwarder  (Medical):   Marland Kitchen Lack of Transportation (Non-Medical):   Physical Activity:   . Days of Exercise per Week:   . Minutes of Exercise per Session:   Stress:   . Feeling of Stress :   Social Connections:   . Frequency of Communication with Friends and Family:   . Frequency of Social Gatherings with Friends and Family:   . Attends Religious Services:   . Active Member of Clubs or Organizations:   . Attends Banker Meetings:   Marland Kitchen Marital Status:   Intimate Partner Violence:   . Fear of Current or Ex-Partner:   . Emotionally Abused:   Marland Kitchen Physically Abused:   . Sexually Abused:     No Known Allergies  Prior to Admission medications   Medication Sig Start Date End Date Taking? Authorizing Provider  doxycycline (VIBRAMYCIN) 100 MG capsule Take 1 capsule (100 mg total) by mouth 2 (two) times daily for 14 days. 11/18/19 12/02/19  Conard Novak, MD    Physical Exam BP 122/74   Ht 5\' 4"  (1.626 m)   Wt 143 lb (64.9 kg)   LMP 11/07/2019   BMI 24.55 kg/m    General: NAD HEENT: normocephalic, anicteric Pulmonary: No increased work of breathing Extremities: no edema, erythema, or tenderness Neurologic: Grossly intact, normal gait Psychiatric: mood appropriate, affect full  Imaging Results 11/09/2019 PELVIS  TRANSVAGINAL NON-OB (TV ONLY)  Result Date: 11/19/2019 [name] DOB: 22-Jan-1996 MRN: [id number] ULTRASOUND REPORT Location: [facility] Date of Service: 11/19/2019 Indications:Abnormal Uterine Bleeding Findings: The uterus is retroverted and measures 6.6 x 4.4 x 3.8 cm. Echo texture is homogenous without evidence of focal masses. The Endometrium measures 4.5 mm. Right Ovary measures 3.3 x 1.9 x 1.9 cm. It is normal in appearance. Left Ovary measures 2.9 x 2.1 x 2.1 cm. It is normal in appearance. Survey of the adnexa demonstrates no adnexal masses. There is no free fluid in the cul de sac. Impression: 1. Normal pelvic ultrasound. Gweneth Dimitri, RT The  ultrasound images and findings were reviewed by me and I agree with the above report. Prentice Docker, MD, [facility], [facility] 11/19/2019 9:53 PM     Assessment: 24 y.o. G1P1001  1. Menorrhagia with irregular cycle      Plan: Problem List Items Addressed This Visit    None    Visit Diagnoses    Menorrhagia with irregular cycle    -  Primary   Relevant Orders   Von Willebrand panel     Reviewed all diagnostic findings with her to this point.  I recommended that she undergo testing for von Willebrand's disease and that she complete the course of antibiotics with doxycycline (which has not been started yet).  We discussed using combined oral contraceptives to regulate her menses.  We also discussed waiting a few cycles to see how things go, since this was an acute issue.  After discussion, she accepts testing for von Willebrand's disease, states that she will take doxycycline, and would like to wait a cycle or 2 to see how her periods are before deciding whether she tries to regulate her periods using combined oral contraceptives.  A total of 30 minutes were spent face-to-face with the patient as well as preparation, review, communication, and documentation during this encounter.   Follow up, as needed.   Prentice Docker, MD, [facility], [facility] 11/25/2019 10:59 AM

## 2019-11-27 LAB — COAG STUDIES INTERP REPORT

## 2019-11-27 LAB — VON WILLEBRAND PANEL
Factor VIII Activity: 88 % (ref 56–140)
Von Willebrand Ag: 64 % (ref 50–200)
Von Willebrand Factor: 48 % — ABNORMAL LOW (ref 50–200)

## 2020-07-13 ENCOUNTER — Ambulatory Visit: Payer: Medicaid Other | Admitting: Advanced Practice Midwife

## 2020-08-20 ENCOUNTER — Ambulatory Visit (HOSPITAL_COMMUNITY)
Admission: EM | Admit: 2020-08-20 | Discharge: 2020-08-20 | Disposition: A | Discharge status: Home / Self Care | Payer: Medicaid Other | Attending: Student | Admitting: Student

## 2020-08-20 ENCOUNTER — Encounter (HOSPITAL_COMMUNITY): Payer: Self-pay | Admitting: *Deleted

## 2020-08-20 ENCOUNTER — Other Ambulatory Visit: Payer: Self-pay

## 2020-08-20 DIAGNOSIS — B373 Candidiasis of vulva and vagina: Secondary | ICD-10-CM | POA: Insufficient documentation

## 2020-08-20 DIAGNOSIS — B3731 Acute candidiasis of vulva and vagina: Secondary | ICD-10-CM

## 2020-08-20 LAB — HIV ANTIBODY (ROUTINE TESTING W REFLEX): HIV Screen 4th Generation wRfx: NONREACTIVE

## 2020-08-20 MED ORDER — TERCONAZOLE 80 MG VA SUPP: 80.0000 mg | Freq: Every day | VAGINAL | 0 refills | Status: DC

## 2020-08-20 MED ORDER — NYSTATIN 100000 UNIT/GM EX CREA: TOPICAL_CREAM | CUTANEOUS | 0 refills | Status: DC

## 2020-08-20 NOTE — ED Provider Notes (Signed)
[facility]    CSN: [id number] Arrival date & time: 08/20/20  1515      History   Chief Complaint Chief Complaint  Patient presents with   Vaginal Discharge   SEXUALLY TRANSMITTED DISEASE    HPI Rebecca Duran is a 25 y.o. female presenting with vaginal itching and discharge for few weeks. Long history of yeast infections. Has been using otc monistat without improvement. States she's been having 3 weeks of clumpy white discharge and external/internal vaginal itching. Denies lesions, sores, fevers. Denies new partners. States she could not be pregnant. Denies hematuria, dysuria, frequency, urgency, back pain, n/v/d/abd pain, fevers/chills. States diflucan doesn't work for her and she prefers vaginal suppository she was prescribed last time.  HPI  Past Medical History:  Diagnosis Date   Medical history non-contributory     Patient Active Problem List   Diagnosis Date Noted   Status post vacuum-assisted vaginal delivery 11/23/2015   Indication for care in labor or delivery 11/21/2015    Past Surgical History:  Procedure Laterality Date   NO PAST SURGERIES      OB History    Gravida  1   Para  1   Term  1   Preterm      AB      Living  1     SAB      IAB      Ectopic      Multiple  0   Live Births  1            Home Medications    Prior to Admission medications   Medication Sig Start Date End Date Taking? Authorizing Provider  nystatin cream (MYCOSTATIN) Apply to affected area 2 times daily 08/20/20  Yes Hazel Sams, PA-C  terconazole (TERAZOL 3) 80 MG vaginal suppository Place 1 suppository (80 mg total) vaginally at bedtime. 08/20/20  Yes Hazel Sams, PA-C    Family History Family History  Problem Relation Age of Onset   Cancer Neg Hx    Diabetes Neg Hx    Hypertension Neg Hx     Social History Social History   Tobacco Use   Smoking status: Never Smoker   Smokeless tobacco: Never Used  Vaping Use    Vaping Use: Never used  Substance Use Topics   Alcohol use: No   Drug use: No     Allergies   Patient has no known allergies.   Review of Systems Review of Systems  Gastrointestinal: Negative for abdominal pain.  Genitourinary: Positive for vaginal discharge. Negative for decreased urine volume, difficulty urinating, dysuria, flank pain, frequency, genital sores, hematuria, menstrual problem, pelvic pain, urgency, vaginal bleeding and vaginal pain.  All other systems reviewed and are negative.    Physical Exam Triage Vital Signs ED Triage Vitals  Enc Vitals Group     BP 08/20/20 1710 123/76     Pulse Rate 08/20/20 1710 76     Resp 08/20/20 1710 16     Temp 08/20/20 1710 98.8 F (37.1 C)     Temp Source 08/20/20 1710 Oral     SpO2 08/20/20 1710 98 %     Weight --      Height --      Head Circumference --      Peak Flow --      Pain Score 08/20/20 1712 6     Pain Loc --      Pain Edu? --      Excl.  in GC? --    No data found.  Updated Vital Signs BP 123/76 (BP Location: Right Arm)    Pulse 76    Temp 98.8 F (37.1 C) (Oral)    Resp 16    LMP 08/16/2020    SpO2 98%   Visual Acuity Right Eye Distance:   Left Eye Distance:   Bilateral Distance:    Right Eye Near:   Left Eye Near:    Bilateral Near:     Physical Exam Vitals reviewed.  Constitutional:      General: She is not in acute distress.    Appearance: Normal appearance. She is not ill-appearing.  HENT:     Head: Normocephalic and atraumatic.  Cardiovascular:     Rate and Rhythm: Normal rate and regular rhythm.     Heart sounds: Normal heart sounds.  Pulmonary:     Effort: Pulmonary effort is normal.     Breath sounds: Normal breath sounds.  Abdominal:     General: Bowel sounds are normal. There is no distension.     Palpations: Abdomen is soft. There is no mass.     Tenderness: There is no abdominal tenderness. There is no right CVA tenderness, left CVA tenderness, guarding or rebound.   Neurological:     General: No focal deficit present.     Mental Status: She is alert and oriented to person, place, and time. Mental status is at baseline.  Psychiatric:        Mood and Affect: Mood normal.        Behavior: Behavior normal.        Thought Content: Thought content normal.        Judgment: Judgment normal.      UC Treatments / Results  Labs (all labs ordered are listed, but only abnormal results are displayed) Labs Reviewed  RPR  HIV ANTIBODY (ROUTINE TESTING W REFLEX)  CERVICOVAGINAL ANCILLARY ONLY    EKG   Radiology No results found.  Procedures Procedures (including critical care time)  Medications Ordered in UC Medications - No data to display  Initial Impression / Assessment and Plan / UC Course  I have reviewed the triage vital signs and the nursing notes.  Pertinent labs & imaging results that were available during my care of the patient were reviewed by me and considered in my medical decision making (see chart for details).     States she could not be pregnant, urine pregnancy test declined.  Pt states diflucan didn't work for her in the past so she requests vaginal suppository; sent as below.   Will send for G/C, trich, yeast, BV testing, HIV, RPR  We have sent testing for sexually transmitted infections. We will notify you of any positive results once they are received. If required, we will prescribe any medications you might need.   Please refrain from all sexual activity for at least the next seven days.  Seek additional medical attention if you develop fevers/chills, new/worsening abdominal pain, new/worsening vaginal discomfort/discharge, etc. Patient verbalizes understanding and agreement.    Final Clinical Impressions(s) / UC Diagnoses   Final diagnoses:  Vaginal yeast infection   Discharge Instructions   None    ED Prescriptions    Medication Sig Dispense Auth. Provider   terconazole (TERAZOL 3) 80 MG vaginal  suppository Place 1 suppository (80 mg total) vaginally at bedtime. 3 suppository Rhys Martini, PA-C   nystatin cream (MYCOSTATIN) Apply to affected area 2 times daily 30 g Ignacia Bayley  E, PA-C     PDMP not reviewed this encounter.   Rhys Martini, PA-C 08/21/20 0830

## 2020-08-20 NOTE — ED Triage Notes (Signed)
Pt reports vag. DC and vag itching for a couple weeks. Pt has been using OTC with out relief.

## 2020-08-21 LAB — RPR: RPR Ser Ql: NONREACTIVE

## 2020-08-22 ENCOUNTER — Telehealth (HOSPITAL_COMMUNITY): Payer: Self-pay | Admitting: Family Medicine

## 2020-08-22 LAB — CERVICOVAGINAL ANCILLARY ONLY
Bacterial Vaginitis (gardnerella): POSITIVE — AB
Candida Glabrata: NEGATIVE
Candida Vaginitis: NEGATIVE
Chlamydia: NEGATIVE
Comment: NEGATIVE
Comment: NEGATIVE
Comment: NEGATIVE
Comment: NEGATIVE
Comment: NEGATIVE
Comment: NORMAL
Neisseria Gonorrhea: NEGATIVE
Trichomonas: NEGATIVE

## 2020-08-22 MED ORDER — METRONIDAZOLE 500 MG PO TABS: 500.0000 mg | ORAL_TABLET | Freq: Two times a day (BID) | ORAL | 0 refills | Status: DC

## 2020-08-22 NOTE — Telephone Encounter (Signed)
Needs RX for BV

## 2020-10-27 ENCOUNTER — Emergency Department (HOSPITAL_COMMUNITY)
Admission: EM | Admit: 2020-10-27 | Discharge: 2020-10-27 | Disposition: A | Discharge status: Home / Self Care | Payer: Medicaid Other | Attending: Emergency Medicine | Admitting: Emergency Medicine

## 2020-10-27 ENCOUNTER — Encounter (HOSPITAL_COMMUNITY): Payer: Self-pay | Admitting: Pharmacy Technician

## 2020-10-27 ENCOUNTER — Other Ambulatory Visit: Payer: Self-pay

## 2020-10-27 DIAGNOSIS — Z5321 Procedure and treatment not carried out due to patient leaving prior to being seen by health care provider: Secondary | ICD-10-CM | POA: Insufficient documentation

## 2020-10-27 DIAGNOSIS — G43909 Migraine, unspecified, not intractable, without status migrainosus: Secondary | ICD-10-CM | POA: Diagnosis not present

## 2020-10-27 DIAGNOSIS — R443 Hallucinations, unspecified: Secondary | ICD-10-CM | POA: Insufficient documentation

## 2020-10-27 NOTE — ED Notes (Signed)
Pt said she will come another day pt left

## 2020-10-27 NOTE — ED Triage Notes (Signed)
Pt here with reports of migraines onset approx 2 weeks ago. Pt states now she is starting to have hallucinations. Pt called neurology who told her she would need a referral.

## 2020-11-03 ENCOUNTER — Other Ambulatory Visit: Payer: Self-pay

## 2020-11-03 ENCOUNTER — Encounter: Payer: Self-pay | Admitting: Family Medicine

## 2020-11-03 ENCOUNTER — Ambulatory Visit (INDEPENDENT_AMBULATORY_CARE_PROVIDER_SITE_OTHER): Payer: Medicaid Other | Admitting: Family Medicine

## 2020-11-03 VITALS — BP 127/78 | HR 73 | Ht 64.5 in | Wt 149.8 lb

## 2020-11-03 DIAGNOSIS — Z7689 Persons encountering health services in other specified circumstances: Secondary | ICD-10-CM | POA: Insufficient documentation

## 2020-11-03 DIAGNOSIS — G43109 Migraine with aura, not intractable, without status migrainosus: Secondary | ICD-10-CM

## 2020-11-03 MED ORDER — SUMATRIPTAN SUCCINATE 50 MG PO TABS: 50.0000 mg | ORAL_TABLET | ORAL | 2 refills | Status: DC | PRN

## 2020-11-03 NOTE — Assessment & Plan Note (Signed)
Pt in today to establish primary care. She has not had a pcp in a while but has GYN provider that she has been seeing.   She is dealing with migraine headaches which we will address today.

## 2020-11-03 NOTE — Assessment & Plan Note (Signed)
Patient with long hx of migraine headaches that she has been having recently with hallucinations, worse on left side.   Plan- Considering her long hx of migraine ha I am going to give her Imitrex for migraine relief.

## 2020-11-03 NOTE — Progress Notes (Signed)
Established Patient Office Visit  SUBJECTIVE:  Subjective  Patient ID: Rebecca Duran, female    DOB: 05/12/1996  Age: 25 y.o. MRN: [id number]  CC:  Chief Complaint  Patient presents with  . New Patient (Initial Visit)    HPI Rebecca Duran is a 25 y.o. female presenting today for     Past Medical History:  Diagnosis Date  . Medical history non-contributory     Past Surgical History:  Procedure Laterality Date  . NO PAST SURGERIES      Family History  Problem Relation Age of Onset  . Cancer Neg Hx   . Diabetes Neg Hx   . Hypertension Neg Hx     Social History   Socioeconomic History  . Marital status: Single    Spouse name: Not on file  . Number of children: Not on file  . Years of education: Not on file  . Highest education level: Not on file  Occupational History  . Not on file  Tobacco Use  . Smoking status: Never Smoker  . Smokeless tobacco: Never Used  Vaping Use  . Vaping Use: Never used  Substance and Sexual Activity  . Alcohol use: No  . Drug use: No  . Sexual activity: Yes    Birth control/protection: None  Other Topics Concern  . Not on file  Social History Narrative  . Not on file   Social Determinants of Health   Financial Resource Strain: Not on file  Food Insecurity: Not on file  Transportation Needs: Not on file  Physical Activity: Not on file  Stress: Not on file  Social Connections: Not on file  Intimate Partner Violence: Not on file     Current Outpatient Medications:  .  SUMAtriptan (IMITREX) 50 MG tablet, Take 1 tablet (50 mg total) by mouth every 2 (two) hours as needed for migraine. May repeat in 2 hours if headache persists or recurs., Disp: 10 tablet, Rfl: 2 .  metroNIDAZOLE (FLAGYL) 500 MG tablet, Take 1 tablet (500 mg total) by mouth 2 (two) times daily., Disp: 14 tablet, Rfl: 0 .  nystatin cream (MYCOSTATIN), Apply to affected area 2 times daily, Disp: 30 g, Rfl: 0 .  terconazole (TERAZOL 3) 80 MG vaginal  suppository, Place 1 suppository (80 mg total) vaginally at bedtime., Disp: 3 suppository, Rfl: 0   No Known Allergies  ROS Review of Systems  Constitutional: Negative.   HENT: Negative.   Respiratory: Negative.   Cardiovascular: Negative.   Gastrointestinal: Negative.   Musculoskeletal: Negative.   Neurological: Positive for headaches.  Psychiatric/Behavioral: Negative.      OBJECTIVE:    Physical Exam Vitals and nursing note reviewed.  Constitutional:      Appearance: Normal appearance.  HENT:     Head: Normocephalic.     Right Ear: Tympanic membrane normal.     Left Ear: Tympanic membrane normal.     Nose: Nose normal.  Cardiovascular:     Rate and Rhythm: Normal rate and regular rhythm.  Musculoskeletal:        General: Normal range of motion.     Cervical back: Normal range of motion.  Skin:    General: Skin is warm.  Neurological:     Mental Status: She is alert.  Psychiatric:        Mood and Affect: Mood normal.     BP 127/78   Pulse 73   Ht 5' 4.5" (1.638 m)   Wt 149 lb 12.8 oz (67.9 kg)  BMI 25.32 kg/m  Wt Readings from Last 3 Encounters:  11/03/20 149 lb 12.8 oz (67.9 kg)  11/25/19 143 lb (64.9 kg)  11/18/19 144 lb (65.3 kg)    Health Maintenance Due  Topic Date Due  . Hepatitis C Screening  Never done  . COVID-19 Vaccine (1) Never done  . HPV VACCINES (1 - 2-dose series) Never done  . TETANUS/TDAP  Never done  . INFLUENZA VACCINE  Never done       Topic Date Due  . HPV VACCINES (1 - 2-dose series) Never done    CBC Latest Ref Rng & Units 11/11/2019 11/21/2015 07/27/2015  WBC 4.0 - 10.5 K/uL 12.0(H) 23.9(H) 13.7(H)  Hemoglobin 12.0 - 15.0 g/dL 13.4 9.5(L) 10.8(L)  Hematocrit 36.0 - 46.0 % 42.3 30.0(L) 33.1(L)  Platelets 150 - 400 K/uL 397 337 302   No flowsheet data found.  No results found for: TSH No results found for: ALBUMIN, ANIONGAP, EGFR, GFR No results found for: CHOL, HDL, LDLCALC, CHOLHDL No results found for: TRIG No  results found for: HGBA1C    ASSESSMENT & PLAN:   Problem List Items Addressed This Visit      Cardiovascular and Mediastinum   Migraine with aura and without status migrainosus, not intractable    Patient with long hx of migraine headaches that she has been having recently with hallucinations, worse on left side.   Plan- Considering her long hx of migraine ha I am going to give her Imitrex for migraine relief.       Relevant Medications   SUMAtriptan (IMITREX) 50 MG tablet     Other   Encounter to establish care - Primary    Pt in today to establish primary care. She has not had a pcp in a while but has GYN provider that she has been seeing.   She is dealing with migraine headaches which we will address today.          Meds ordered this encounter  Medications  . SUMAtriptan (IMITREX) 50 MG tablet    Sig: Take 1 tablet (50 mg total) by mouth every 2 (two) hours as needed for migraine. May repeat in 2 hours if headache persists or recurs.    Dispense:  10 tablet    Refill:  2      Follow-up: No follow-ups on file.    Beckie Salts, [place] [address], [place], [place] [address]

## 2020-11-11 NOTE — Addendum Note (Signed)
Addended by: Melody Comas L on: 11/11/2020 11:09 AM   Modules accepted: Orders

## 2020-12-02 ENCOUNTER — Ambulatory Visit: Payer: Medicaid Other | Admitting: Family Medicine

## 2021-01-31 ENCOUNTER — Ambulatory Visit: Payer: Medicaid Other | Admitting: Neurology

## 2021-08-20 NOTE — L&D Delivery Note (Addendum)
OB/GYN Faculty Practice Delivery Note  Rebecca Duran is a 26 y.o. G2P1001 s/p SVD at [redacted]w[redacted]d. She was admitted for IOL for fetal tachycardia.   ROM: 0h 58m with clear fluid GBS Status: neg Maximum Maternal Temperature: 98.5  Labor Progress: Progressed well from 3 cm on admission to complete, did not have epidural. Labored walking and on hands and knees.   Delivery Date/Time: 06/19/22, 2234 Delivery: Called to room and patient was complete and pushing. Head delivered OA. No nuchal cord present. Shoulder and body delivered in usual fashion. Infant with spontaneous cry, placed on mother's abdomen, dried and stimulated. Cord clamped x 2 after 1-minute delay, and cut by FOB under my direct supervision. Cord blood drawn. Placenta delivered spontaneously with gentle cord traction. Fundus firm with massage and Pitocin. Labia, perineum, vagina, and cervix were inspected, first degree perineal laceration repaired with two subcuticular interrupted sutures with 3.0 vicryl, good hemostasis.   Placenta: complete, three vessel cord appreciated Complications: none Lacerations: first degree perineal repaired as above EBL: 67 mL Analgesia: IV fentanyl  Infant: female  APGARs 9/9  weight pending  Cecilio Asper, MD [facility] [place]   Fellow ATTESTATION  I was present and gloved for this delivery and agree with the above documentation in the resident's note except as below.  Concepcion Living, MD [facility] (Faculty Practice) 06/20/2022, 3:53 AM

## 2021-10-06 ENCOUNTER — Encounter (HOSPITAL_COMMUNITY): Payer: Self-pay | Admitting: *Deleted

## 2021-10-06 ENCOUNTER — Other Ambulatory Visit: Payer: Self-pay

## 2021-10-06 ENCOUNTER — Ambulatory Visit (HOSPITAL_COMMUNITY)
Admission: EM | Admit: 2021-10-06 | Discharge: 2021-10-06 | Disposition: A | Discharge status: Home / Self Care | Payer: Medicaid Other | Attending: Physician Assistant | Admitting: Physician Assistant

## 2021-10-06 DIAGNOSIS — N390 Urinary tract infection, site not specified: Secondary | ICD-10-CM

## 2021-10-06 DIAGNOSIS — R319 Hematuria, unspecified: Secondary | ICD-10-CM | POA: Diagnosis not present

## 2021-10-06 LAB — POCT URINALYSIS DIPSTICK, ED / UC
Bilirubin Urine: NEGATIVE
Glucose, UA: NEGATIVE mg/dL
Nitrite: NEGATIVE
Protein, ur: >=300 mg/dL — AB
Specific Gravity, Urine: 1.025 (ref 1.005–1.030)
Urobilinogen, UA: 1 mg/dL (ref 0.0–1.0)
pH: 7 (ref 5.0–8.0)

## 2021-10-06 LAB — POC URINE PREG, ED: Preg Test, Ur: NEGATIVE

## 2021-10-06 MED ORDER — CEPHALEXIN 500 MG PO CAPS: 500.0000 mg | ORAL_CAPSULE | Freq: Four times a day (QID) | ORAL | 0 refills | Status: AC

## 2021-10-06 NOTE — ED Triage Notes (Signed)
C/O dysuria, polyuria, urinary urgency onset last night. Reports "light pink" noted on toilet tissue. Denies fevers. C/O low abd pain and bilat flank pain. Taking IBU.

## 2021-10-06 NOTE — ED Provider Notes (Signed)
[place]    CSN: [id number] Arrival date & time: 10/06/21  0813      History   Chief Complaint Chief Complaint  Patient presents with   Dysuria    HPI Rebecca Duran is a 26 y.o. female.   Pt complains of burning and frequency  The history is provided by the patient. No language interpreter was used.  Dysuria Pain quality:  Aching Pain severity:  Moderate Onset quality:  Gradual Duration:  1 day Timing:  Constant Progression:  Worsening Relieved by:  Nothing Ineffective treatments:  None tried  History reviewed. No pertinent past medical history.  Patient Active Problem List   Diagnosis Date Noted   Encounter to establish care 11/03/2020   Migraine with aura and without status migrainosus, not intractable 11/03/2020   Status post vacuum-assisted vaginal delivery 11/23/2015   Indication for care in labor or delivery 11/21/2015    Past Surgical History:  Procedure Laterality Date   NO PAST SURGERIES      OB History     Gravida  1   Para  1   Term  1   Preterm      AB      Living  1      SAB      IAB      Ectopic      Multiple  0   Live Births  1            Home Medications    Prior to Admission medications   Medication Sig Start Date End Date Taking? Authorizing Provider  metroNIDAZOLE (FLAGYL) 500 MG tablet Take 1 tablet (500 mg total) by mouth 2 (two) times daily. 08/22/20   Raylene Everts, MD  nystatin cream (MYCOSTATIN) Apply to affected area 2 times daily 08/20/20   Hazel Sams, PA-C  SUMAtriptan (IMITREX) 50 MG tablet Take 1 tablet (50 mg total) by mouth every 2 (two) hours as needed for migraine. May repeat in 2 hours if headache persists or recurs. 11/03/20   Beckie Salts, FNP  terconazole (TERAZOL 3) 80 MG vaginal suppository Place 1 suppository (80 mg total) vaginally at bedtime. 08/20/20   Hazel Sams, PA-C    Family History Family History  Problem Relation Age of Onset   Healthy Mother     Healthy Father    Cancer Neg Hx    Diabetes Neg Hx    Hypertension Neg Hx     Social History Social History   Tobacco Use   Smoking status: Never   Smokeless tobacco: Never  Vaping Use   Vaping Use: Never used  Substance Use Topics   Alcohol use: Yes    Comment: occasionally   Drug use: No     Allergies   Patient has no known allergies.   Review of Systems Review of Systems  Genitourinary:  Positive for dysuria.  All other systems reviewed and are negative.   Physical Exam Triage Vital Signs ED Triage Vitals  Enc Vitals Group     BP 10/06/21 0838 130/78     Pulse Rate 10/06/21 0838 93     Resp 10/06/21 0838 18     Temp 10/06/21 0838 98.6 F (37 C)     Temp Source 10/06/21 0838 Oral     SpO2 10/06/21 0838 96 %     Weight --      Height --      Head Circumference --      Peak Flow --  Pain Score 10/06/21 0839 5     Pain Loc --      Pain Edu? --      Excl. in [place]? --    No data found.  Updated Vital Signs BP 130/78    Pulse 93    Temp 98.6 F (37 C) (Oral)    Resp 18    LMP 09/20/2021 (Approximate)    SpO2 96%   Visual Acuity Right Eye Distance:   Left Eye Distance:   Bilateral Distance:    Right Eye Near:   Left Eye Near:    Bilateral Near:     Physical Exam Vitals and nursing note reviewed.  Constitutional:      General: She is not in acute distress.    Appearance: She is well-developed.  HENT:     Head: Normocephalic and atraumatic.  Eyes:     Conjunctiva/sclera: Conjunctivae normal.  Cardiovascular:     Rate and Rhythm: Normal rate and regular rhythm.     Heart sounds: No murmur heard. Pulmonary:     Effort: Pulmonary effort is normal. No respiratory distress.     Breath sounds: Normal breath sounds.  Abdominal:     Palpations: Abdomen is soft.     Tenderness: There is no abdominal tenderness.  Musculoskeletal:        General: No swelling.     Cervical back: Neck supple.  Skin:    General: Skin is warm and dry.     Capillary  Refill: Capillary refill takes less than 2 seconds.  Neurological:     Mental Status: She is alert.  Psychiatric:        Mood and Affect: Mood normal.     UC Treatments / Results  Labs (all labs ordered are listed, but only abnormal results are displayed) Labs Reviewed  POCT URINALYSIS DIPSTICK, ED / UC - Abnormal; Notable for the following components:      Result Value   Ketones, ur TRACE (*)    Hgb urine dipstick MODERATE (*)    Protein, ur >=300 (*)    Leukocytes,Ua LARGE (*)    All other components within normal limits  POC URINE PREG, ED    EKG   Radiology No results found.  Procedures Procedures (including critical care time)  Medications Ordered in UC Medications - No data to display  Initial Impression / Assessment and Plan / UC Course  I have reviewed the triage vital signs and the nursing notes.  Pertinent labs & imaging results that were available during my care of the patient were reviewed by me and considered in my medical decision making (see chart for details).     MDM:  Pt counseled on uti  Rx for keflex Final Clinical Impressions(s) / UC Diagnoses   Final diagnoses:  Urinary tract infection with hematuria, site unspecified   Discharge Instructions   None    ED Prescriptions     Medication Sig Dispense Auth. Provider   cephALEXin (KEFLEX) 500 MG capsule Take 1 capsule (500 mg total) by mouth 4 (four) times daily for 7 days. 28 capsule Fransico Meadow, [place]      PDMP not reviewed this encounter.   Fransico Meadow, [place] 10/06/21 [id number]

## 2021-10-16 ENCOUNTER — Ambulatory Visit: Payer: Medicaid Other | Admitting: Internal Medicine

## 2021-10-23 ENCOUNTER — Telehealth (HOSPITAL_COMMUNITY): Payer: Self-pay

## 2021-10-23 ENCOUNTER — Other Ambulatory Visit: Payer: Self-pay

## 2021-10-23 ENCOUNTER — Ambulatory Visit (HOSPITAL_COMMUNITY)
Admission: EM | Admit: 2021-10-23 | Discharge: 2021-10-23 | Disposition: A | Discharge status: Home / Self Care | Payer: Medicaid Other | Attending: Internal Medicine | Admitting: Internal Medicine

## 2021-10-23 DIAGNOSIS — Z3201 Encounter for pregnancy test, result positive: Secondary | ICD-10-CM

## 2021-10-23 DIAGNOSIS — O0001 Abdominal pregnancy with intrauterine pregnancy: Secondary | ICD-10-CM

## 2021-10-23 DIAGNOSIS — N3001 Acute cystitis with hematuria: Secondary | ICD-10-CM | POA: Diagnosis not present

## 2021-10-23 LAB — POCT URINALYSIS DIPSTICK, ED / UC
Bilirubin Urine: NEGATIVE
Bilirubin Urine: NEGATIVE
Glucose, UA: NEGATIVE mg/dL
Glucose, UA: NEGATIVE mg/dL
Ketones, ur: NEGATIVE mg/dL
Ketones, ur: NEGATIVE mg/dL
Nitrite: NEGATIVE
Nitrite: NEGATIVE
Protein, ur: NEGATIVE mg/dL
Protein, ur: NEGATIVE mg/dL
Specific Gravity, Urine: 1.02 (ref 1.005–1.030)
Specific Gravity, Urine: 1.025 (ref 1.005–1.030)
Urobilinogen, UA: 0.2 mg/dL (ref 0.0–1.0)
Urobilinogen, UA: 0.2 mg/dL (ref 0.0–1.0)
pH: 5.5 (ref 5.0–8.0)
pH: 5.5 (ref 5.0–8.0)

## 2021-10-23 LAB — POC URINE PREG, ED: Preg Test, Ur: POSITIVE — AB

## 2021-10-23 MED ORDER — NITROFURANTOIN MONOHYD MACRO 100 MG PO CAPS: 100.0000 mg | ORAL_CAPSULE | Freq: Two times a day (BID) | ORAL | 0 refills | Status: DC

## 2021-10-23 NOTE — Discharge Instructions (Addendum)
Start on prenatal vitamins as soon as you can and make an appointment with your OB Dr soon.  ?We will call you if your urine culture comes back showing we need to change your medication.  ?

## 2021-10-23 NOTE — ED Triage Notes (Signed)
C/O dysuria after finish antibiotic on 10/16/2021. No fever or chills to note at this time.  ?

## 2021-10-23 NOTE — Telephone Encounter (Signed)
Unsuccessful call to patient. LMTCB concerning visit. ?

## 2021-10-23 NOTE — ED Provider Notes (Signed)
?[facility] ? ? ? ?CSN: [id number] ?Arrival date & time: 10/23/21  1746 ? ? ?  ? ?History   ?Chief Complaint ?No chief complaint on file. ? ? ?HPI ?Rebecca Duran is a 26 y.o. female who presents with onset of dysuria 2 days after finishing up the Keflex last week for UTI. She is 3 days late on her period. Her only child is 38 y/o ? ? ? ?No past medical history on file. ? ?Patient Active Problem List  ? Diagnosis Date Noted  ? Encounter to establish care 11/03/2020  ? Migraine with aura and without status migrainosus, not intractable 11/03/2020  ? Status post vacuum-assisted vaginal delivery 11/23/2015  ? Indication for care in labor or delivery 11/21/2015  ? ? ?Past Surgical History:  ?Procedure Laterality Date  ? NO PAST SURGERIES    ? ? ?OB History   ? ? Gravida  ?1  ? Para  ?1  ? Term  ?1  ? Preterm  ?   ? AB  ?   ? Living  ?1  ?  ? ? SAB  ?   ? IAB  ?   ? Ectopic  ?   ? Multiple  ?0  ? Live Births  ?1  ?   ?  ?  ? ? ? ?Home Medications   ? ?Prior to Admission medications   ?Medication Sig Start Date End Date Taking? Authorizing Provider  ?nitrofurantoin, macrocrystal-monohydrate, (MACROBID) 100 MG capsule Take 1 capsule (100 mg total) by mouth 2 (two) times daily. 10/23/21  Yes Rodriguez-Southworth, Nettie Elm, PA-C  ?metroNIDAZOLE (FLAGYL) 500 MG tablet Take 1 tablet (500 mg total) by mouth 2 (two) times daily. 08/22/20   Eustace Moore, MD  ?nystatin cream (MYCOSTATIN) Apply to affected area 2 times daily 08/20/20   Rhys Martini, PA-C  ?SUMAtriptan (IMITREX) 50 MG tablet Take 1 tablet (50 mg total) by mouth every 2 (two) hours as needed for migraine. May repeat in 2 hours if headache persists or recurs. 11/03/20   Irish Lack, FNP  ?terconazole (TERAZOL 3) 80 MG vaginal suppository Place 1 suppository (80 mg total) vaginally at bedtime. 08/20/20   Rhys Martini, PA-C  ? ? ?Family History ?Family History  ?Problem Relation Age of Onset  ? Healthy Mother   ? Healthy Father   ? Cancer Neg Hx   ? Diabetes  Neg Hx   ? Hypertension Neg Hx   ? ? ?Social History ?Social History  ? ?Tobacco Use  ? Smoking status: Never  ? Smokeless tobacco: Never  ?Vaping Use  ? Vaping Use: Never used  ?Substance Use Topics  ? Alcohol use: Yes  ?  Comment: occasionally  ? Drug use: No  ? ? ? ?Allergies   ?Patient has no known allergies. ? ? ?Review of Systems ?Review of Systems  ?Constitutional:  Negative for fever.  ?Genitourinary:  Positive for dysuria and frequency. Negative for flank pain, pelvic pain, urgency, vaginal bleeding and vaginal discharge.  ? ? ?Physical Exam ?Triage Vital Signs ?ED Triage Vitals  ?Enc Vitals Group  ?   BP 10/23/21 1930 116/73  ?   Pulse --   ?   Resp 10/23/21 1927 16  ?   Temp 10/23/21 1927 98.4 ?F (36.9 ?C)  ?   Temp Source 10/23/21 1927 Oral  ?   SpO2 10/23/21 1927 100 %  ?   Weight --   ?   Height --   ?   Head Circumference --   ?  Peak Flow --   ?   Pain Score 10/23/21 1928 0  ?   Pain Loc --   ?   Pain Edu? --   ?   Excl. in [place]? --   ? ?No data found. ? ?Updated Vital Signs ?BP 116/73 (BP Location: Left Arm)   Temp 98.4 ?F (36.9 ?C) (Oral)   Resp 16   SpO2 100%  ? ?Visual Acuity ?Right Eye Distance:   ?Left Eye Distance:   ?Bilateral Distance:   ? ?Right Eye Near:   ?Left Eye Near:    ?Bilateral Near:    ? ? ? ?Physical Exam ?Vitals and nursing note reviewed.  ?Constitutional:   ?   General: She is not in acute distress. ?   Appearance: She is not toxic-appearing.  ?HENT:  ?   Head: Normocephalic.  ?   Right Ear: External ear normal.  ?   Left Ear: External ear normal.  ?Eyes:  ?   General: No scleral icterus. ?   Conjunctiva/sclera: Conjunctivae normal.  ?Pulmonary:  ?   Effort: Pulmonary effort is normal.  ?Abdominal:  ?   General: Bowel sounds are normal.  ?   Palpations: Abdomen is soft. There is no mass.  ?   Tenderness: she has mild tenderness on R mid abd.  There is no guarding or rebound.  ?   Comments: - CVA tenderness   ?Musculoskeletal:     ?   General: Normal range of motion.  ?    Cervical back: Neck supple.  ?    ?Skin: ?   General: Skin is warm and dry.  ?   Findings: No rash.  ?Neurological:  ?   Mental Status: She is alert and oriented to person, place, and time.  ?   Gait: Gait normal.  ?Psychiatric:     ?   Mood and Affect: Mood normal.     ?   Behavior: Behavior normal.     ?   Thought Content: Thought content normal.     ?   Judgment: Judgment normal.  ? ?UC Treatments / Results  ?Labs ?(all labs ordered are listed, but only abnormal results are displayed) ?Labs Reviewed  ?POCT URINALYSIS DIPSTICK, ED / UC - Abnormal; Notable for the following components:  ?    Result Value  ? Hgb urine dipstick MODERATE (*)   ? Leukocytes,Ua MODERATE (*)   ? All other components within normal limits  ?POC URINE PREG, ED - Abnormal; Notable for the following components:  ? Preg Test, Ur POSITIVE (*)   ? All other components within normal limits  ?POCT URINALYSIS DIPSTICK, ED / UC - Abnormal; Notable for the following components:  ? Hgb urine dipstick MODERATE (*)   ? Leukocytes,Ua MODERATE (*)   ? All other components within normal limits  ?URINE CULTURE  ? ? ?EKG ? ? ?Radiology ?No results found. ? ?Procedures ?Procedures (including critical care time) ? ?Medications Ordered in UC ?Medications - No data to display ? ?Initial Impression / Assessment and Plan / UC Course  ?I have reviewed the triage vital signs and the nursing notes. ?Pertinent labs  results that were available during my care of the patient were reviewed by me and considered in my medical decision making (see chart for details). ?Recurrent UTI ?I sent her urine for a culture and we will call her if we need to change the medication ?I placed her on Macrobid as noted ?See instructions  ?  ? ? ?  Final Clinical Impressions(s) / UC Diagnoses  ? ?Final diagnoses:  ?Acute cystitis with hematuria  ?Abdominal pregnancy with intrauterine pregnancy  ? ? ? ?Discharge Instructions   ? ?  ?Start on prenatal vitamins as soon as you can and make an  appointment with your OB Dr soon.  ?We will call you if your urine culture comes back showing we need to change your medication.  ? ? ? ?ED Prescriptions   ? ? Medication Sig Dispense Auth. Provider  ? nitrofurantoin, macrocrystal-monohydrate, (MACROBID) 100 MG capsule Take 1 capsule (100 mg total) by mouth 2 (two) times daily. 14 capsule Rodriguez-Southworth, Nettie Elm, PA-C  ? ?  ? ?PDMP not reviewed this encounter. ?  ?Garey Ham, PA-C ?10/23/21 2020 ? ?

## 2021-10-25 ENCOUNTER — Inpatient Hospital Stay (HOSPITAL_COMMUNITY)
Admission: AD | Admit: 2021-10-25 | Discharge: 2021-10-25 | Disposition: A | Discharge status: Home / Self Care | Payer: Medicaid Other | Attending: Obstetrics & Gynecology | Admitting: Obstetrics & Gynecology

## 2021-10-25 ENCOUNTER — Inpatient Hospital Stay (HOSPITAL_COMMUNITY): Payer: Medicaid Other

## 2021-10-25 ENCOUNTER — Other Ambulatory Visit: Payer: Self-pay

## 2021-10-25 ENCOUNTER — Encounter (HOSPITAL_COMMUNITY): Payer: Self-pay | Admitting: Obstetrics & Gynecology

## 2021-10-25 DIAGNOSIS — R109 Unspecified abdominal pain: Secondary | ICD-10-CM

## 2021-10-25 DIAGNOSIS — O26899 Other specified pregnancy related conditions, unspecified trimester: Secondary | ICD-10-CM

## 2021-10-25 DIAGNOSIS — Z87440 Personal history of urinary (tract) infections: Secondary | ICD-10-CM | POA: Diagnosis not present

## 2021-10-25 DIAGNOSIS — R102 Pelvic and perineal pain: Secondary | ICD-10-CM | POA: Insufficient documentation

## 2021-10-25 DIAGNOSIS — Z3A01 Less than 8 weeks gestation of pregnancy: Secondary | ICD-10-CM | POA: Diagnosis not present

## 2021-10-25 DIAGNOSIS — O26891 Other specified pregnancy related conditions, first trimester: Secondary | ICD-10-CM | POA: Diagnosis not present

## 2021-10-25 DIAGNOSIS — M549 Dorsalgia, unspecified: Secondary | ICD-10-CM | POA: Diagnosis not present

## 2021-10-25 DIAGNOSIS — Z3490 Encounter for supervision of normal pregnancy, unspecified, unspecified trimester: Secondary | ICD-10-CM

## 2021-10-25 DIAGNOSIS — O209 Hemorrhage in early pregnancy, unspecified: Secondary | ICD-10-CM | POA: Diagnosis not present

## 2021-10-25 DIAGNOSIS — R300 Dysuria: Secondary | ICD-10-CM | POA: Diagnosis not present

## 2021-10-25 LAB — CBC WITH DIFFERENTIAL/PLATELET
Abs Immature Granulocytes: 0.04 10*3/uL (ref 0.00–0.07)
Basophils Absolute: 0.1 10*3/uL (ref 0.0–0.1)
Basophils Relative: 1 %
Eosinophils Absolute: 0.2 10*3/uL (ref 0.0–0.5)
Eosinophils Relative: 2 %
HCT: 39.5 % (ref 36.0–46.0)
Hemoglobin: 12.8 g/dL (ref 12.0–15.0)
Immature Granulocytes: 0 %
Lymphocytes Relative: 29 %
Lymphs Abs: 3.5 10*3/uL (ref 0.7–4.0)
MCH: 27.4 pg (ref 26.0–34.0)
MCHC: 32.4 g/dL (ref 30.0–36.0)
MCV: 84.6 fL (ref 80.0–100.0)
Monocytes Absolute: 0.9 10*3/uL (ref 0.1–1.0)
Monocytes Relative: 8 %
Neutro Abs: 7.3 10*3/uL (ref 1.7–7.7)
Neutrophils Relative %: 60 %
Platelets: 440 10*3/uL — ABNORMAL HIGH (ref 150–400)
RBC: 4.67 MIL/uL (ref 3.87–5.11)
RDW: 13.2 % (ref 11.5–15.5)
WBC: 12 10*3/uL — ABNORMAL HIGH (ref 4.0–10.5)
nRBC: 0 % (ref 0.0–0.2)

## 2021-10-25 LAB — COMPREHENSIVE METABOLIC PANEL
ALT: 19 U/L (ref 0–44)
AST: 21 U/L (ref 15–41)
Albumin: 4.2 g/dL (ref 3.5–5.0)
Alkaline Phosphatase: 53 U/L (ref 38–126)
Anion gap: 12 (ref 5–15)
BUN: 5 mg/dL — ABNORMAL LOW (ref 6–20)
CO2: 21 mmol/L — ABNORMAL LOW (ref 22–32)
Calcium: 9.2 mg/dL (ref 8.9–10.3)
Chloride: 101 mmol/L (ref 98–111)
Creatinine, Ser: 0.67 mg/dL (ref 0.44–1.00)
GFR, Estimated: >60 mL/min (ref >60)
Glucose, Bld: 89 mg/dL (ref 70–99)
Potassium: 3.4 mmol/L — ABNORMAL LOW (ref 3.5–5.1)
Sodium: 134 mmol/L — ABNORMAL LOW (ref 135–145)
Total Bilirubin: 1.2 mg/dL (ref 0.3–1.2)
Total Protein: 7.7 g/dL (ref 6.5–8.1)

## 2021-10-25 LAB — URINALYSIS, ROUTINE W REFLEX MICROSCOPIC
Bilirubin Urine: NEGATIVE
Glucose, UA: NEGATIVE mg/dL
Ketones, ur: 20 mg/dL — AB
Leukocytes,Ua: NEGATIVE
Nitrite: NEGATIVE
Protein, ur: NEGATIVE mg/dL
Specific Gravity, Urine: 1.004 — ABNORMAL LOW (ref 1.005–1.030)
pH: 6 (ref 5.0–8.0)

## 2021-10-25 LAB — HCG, QUANTITATIVE, PREGNANCY: hCG, Beta Chain, Quant, S: 9740 m[IU]/mL — ABNORMAL HIGH (ref <5)

## 2021-10-25 MED ORDER — HYDROCODONE-ACETAMINOPHEN 5-325 MG PO TABS
1.0000 | ORAL_TABLET | Freq: Once | ORAL | Status: AC
  Administered 2021-10-25: 1 via ORAL
  Filled 2021-10-25: qty 1

## 2021-10-25 MED ORDER — CEFADROXIL 500 MG PO CAPS: 500.0000 mg | ORAL_CAPSULE | Freq: Two times a day (BID) | ORAL | 0 refills | Status: DC

## 2021-10-25 MED ORDER — HYDROMORPHONE HCL 1 MG/ML IJ SOLN
0.5000 mg | Freq: Once | INTRAMUSCULAR | Status: DC
Start: 2021-10-25 — End: 2021-10-25

## 2021-10-25 NOTE — MAU Note (Signed)
Rebecca Duran is a 26 y.o. at [redacted]w[redacted]d here in MAU reporting: states this is her 2nd bladder infection in the past 2 weeks. Has taken 1 full round of antibiotic and is currently taking macrobid. Started back with dysuria on Monday. Now having abdominal pain and back pain. ? ?LMP: 09/21/21 ? ?Onset of complaint: ongoing ? ?Pain score: back 8/10, abdomen 7/10 ? ?Vitals:  ? 10/25/21 1752 10/25/21 1828  ?BP: 133/82 134/79  ?Pulse: 88 99  ?Resp: 14 16  ?Temp: 98.7 ?F (37.1 ?C) 99 ?F (37.2 ?C)  ?SpO2: 100% 100%  ? ?Lab orders placed from triage: none ? ?

## 2021-10-25 NOTE — MAU Provider Note (Addendum)
History     CSN: [id number]  Arrival date and time: 10/25/21 1743   Event Date/Time   First Provider Initiated Contact with Patient 10/25/21 1831      Chief Complaint  Patient presents with   Abdominal Pain   Back Pain   HPI Rebecca Duran is a 26 y.o. G2P1001 at [redacted]w[redacted]d by LMP who presents to MAU from [facility] for evaluation of abdominal and back pain in the setting of recurrent UTI.  UTI Patient reports recurrent dysuria and urinary frequency beginning 10/06/2021. She completed the Keflex she was prescribed by urgent care around 10/16/2021. She continued to be symptomatic and experienced new onset CVAT so she returned to Urgent Care 10/23/2021. She was prescribed Macrobid and initiated that medication right away.  Back Pain Patient's pain is unrelieved since onset 10/06/2021. Her pain is bilateral, located at her CVA and radiating down her flanks. Pain score is 8/10. She denies aggravating or alleviating factors. She has not taken medication or tried other treatments for this complaint.  Abdominal Pain Patient's pain is worsening since onset within the past two days. Pain sites include her left mid and bilateral lower abdomen near her groin. Pain does not radiate. Pain score is 7/10. She denies aggravating or alleviating factors. She has not taken medication for this complaint.   "Passing tissue" Patient reports one episode of "passing tissue" yesterday. She denies overt vaginal bleeding , abnormal vaginal discharge, fever or recent illness.  OB History     Gravida  2   Para  1   Term  1   Preterm      AB      Living  1      SAB      IAB      Ectopic      Multiple  0   Live Births  1           No past medical history on file.  Past Surgical History:  Procedure Laterality Date   NO PAST SURGERIES      Family History  Problem Relation Age of Onset   Healthy Mother    Healthy Father    Cancer Neg Hx    Diabetes Neg Hx    Hypertension Neg Hx      Social History   Tobacco Use   Smoking status: Never   Smokeless tobacco: Never  Vaping Use   Vaping Use: Never used  Substance Use Topics   Alcohol use: Yes    Comment: occasionally   Drug use: No    Allergies: No Known Allergies  Medications Prior to Admission  Medication Sig Dispense Refill Last Dose   metroNIDAZOLE (FLAGYL) 500 MG tablet Take 1 tablet (500 mg total) by mouth 2 (two) times daily. 14 tablet 0    nitrofurantoin, macrocrystal-monohydrate, (MACROBID) 100 MG capsule Take 1 capsule (100 mg total) by mouth 2 (two) times daily. 14 capsule 0    nystatin cream (MYCOSTATIN) Apply to affected area 2 times daily 30 g 0    SUMAtriptan (IMITREX) 50 MG tablet Take 1 tablet (50 mg total) by mouth every 2 (two) hours as needed for migraine. May repeat in 2 hours if headache persists or recurs. 10 tablet 2    terconazole (TERAZOL 3) 80 MG vaginal suppository Place 1 suppository (80 mg total) vaginally at bedtime. 3 suppository 0     Review of Systems  Constitutional:  Positive for fatigue.  Gastrointestinal:  Positive for abdominal pain.  Genitourinary:  Positive  for flank pain and frequency.  All other systems reviewed and are negative. Physical Exam   Blood pressure 134/79, pulse 99, temperature 99 F (37.2 C), temperature source Oral, resp. rate 16, height 5\' 5"  (1.651 m), weight 73.8 kg, last menstrual period 09/21/2021, SpO2 100 %.  Physical Exam Vitals and nursing note reviewed. Exam conducted with a chaperone present.  Constitutional:      Appearance: She is well-developed. She is ill-appearing.  Cardiovascular:     Rate and Rhythm: Normal rate and regular rhythm.     Heart sounds: Normal heart sounds.  Pulmonary:     Effort: Pulmonary effort is normal.     Breath sounds: Normal breath sounds.  Abdominal:     General: Abdomen is flat.     Palpations: Abdomen is soft.     Tenderness: There is no abdominal tenderness. There is right CVA tenderness and left  CVA tenderness.  Skin:    Capillary Refill: Capillary refill takes less than 2 seconds.  Neurological:     Mental Status: She is alert and oriented to person, place, and time.  Psychiatric:        Mood and Affect: Mood normal.        Behavior: Behavior normal.    MAU Course  Procedures  MDM  Patient Vitals for the past 24 hrs:  BP Temp Temp src Pulse Resp SpO2 Height Weight  10/25/21 1828 134/79 99 F (37.2 C) Oral 99 16 100 % -- --  10/25/21 1823 -- -- -- -- -- -- 5\' 5"  (1.651 m) 73.8 kg  10/25/21 1752 133/82 98.7 F (37.1 C) Oral 88 14 100 % -- --   Orders Placed This Encounter  Procedures   US OB Transvaginal   US RENAL   CBC with Differential/Platelet   hCG, quantitative, pregnancy   Comprehensive metabolic panel   Urinalysis, Routine w reflex microscopic Urine, Clean Catch   Report given to Sabas SousJ. Duncan Alejandro, CNM who assumes care of patient at this time.  Clayton BiblesSamantha Weinhold, MSA, MSN, CNM Certified Nurse Midwife, [facility] for [facility], [facility] 10/25/21 7:55 PM  Assessment and Plan   Results for orders placed or performed during the hospital encounter of 10/25/21 (from the past 24 hour(s))  CBC with Differential/Platelet     Status: Abnormal   Collection Time: 10/25/21  6:42 PM  Result Value Ref Range   WBC 12.0 (H) 4.0 - 10.5 K/uL   RBC 4.67 3.87 - 5.11 MIL/uL   Hemoglobin 12.8 12.0 - 15.0 g/dL   HCT 16.139.5 09.636.0 - 04.546.0 %   MCV 84.6 80.0 - 100.0 fL   MCH 27.4 26.0 - 34.0 pg   MCHC 32.4 30.0 - 36.0 g/dL   RDW 40.913.2 81.111.5 - 91.415.5 %   Platelets 440 (H) 150 - 400 K/uL   nRBC 0.0 0.0 - 0.2 %   Neutrophils Relative % 60 %   Neutro Abs 7.3 1.7 - 7.7 K/uL   Lymphocytes Relative 29 %   Lymphs Abs 3.5 0.7 - 4.0 K/uL   Monocytes Relative 8 %   Monocytes Absolute 0.9 0.1 - 1.0 K/uL   Eosinophils Relative 2 %   Eosinophils Absolute 0.2 0.0 - 0.5 K/uL   Basophils Relative 1 %   Basophils Absolute 0.1 0.0 - 0.1 K/uL   Immature  Granulocytes 0 %   Abs Immature Granulocytes 0.04 0.00 - 0.07 K/uL  hCG, quantitative, pregnancy     Status: Abnormal   Collection Time:  10/25/21  6:42 PM  Result Value Ref Range   hCG, Beta Chain, Quant, S 9,740 (H) <5 mIU/mL  Comprehensive metabolic panel     Status: Abnormal   Collection Time: 10/25/21  6:42 PM  Result Value Ref Range   Sodium 134 (L) 135 - 145 mmol/L   Potassium 3.4 (L) 3.5 - 5.1 mmol/L   Chloride 101 98 - 111 mmol/L   CO2 21 (L) 22 - 32 mmol/L   Glucose, Bld 89 70 - 99 mg/dL   BUN 5 (L) 6 - 20 mg/dL   Creatinine, Ser 5.36 0.44 - 1.00 mg/dL   Calcium 9.2 8.9 - 64.4 mg/dL   Total Protein 7.7 6.5 - 8.1 g/dL   Albumin 4.2 3.5 - 5.0 g/dL   AST 21 15 - 41 U/L   ALT 19 0 - 44 U/L   Alkaline Phosphatase 53 38 - 126 U/L   Total Bilirubin 1.2 0.3 - 1.2 mg/dL   GFR, Estimated >03 >47 mL/min   Anion gap 12 5 - 15  Urinalysis, Routine w reflex microscopic Urine, Clean Catch     Status: Abnormal   Collection Time: 10/25/21  7:59 PM  Result Value Ref Range   Color, Urine STRAW (A) YELLOW   APPearance CLEAR CLEAR   Specific Gravity, Urine 1.004 (L) 1.005 - 1.030   pH 6.0 5.0 - 8.0   Glucose, UA NEGATIVE NEGATIVE mg/dL   Hgb urine dipstick SMALL (A) NEGATIVE   Bilirubin Urine NEGATIVE NEGATIVE   Ketones, ur 20 (A) NEGATIVE mg/dL   Protein, ur NEGATIVE NEGATIVE mg/dL   Nitrite NEGATIVE NEGATIVE   Leukocytes,Ua NEGATIVE NEGATIVE   RBC / HPF 0-5 0 - 5 RBC/hpf   WBC, UA 0-5 0 - 5 WBC/hpf   Bacteria, UA RARE (A) NONE SEEN   Squamous Epithelial / LPF 0-5 0 - 5   US OB Transvaginal  Result Date: 10/25/2021 CLINICAL DATA:  Vaginal bleeding, pain EXAM: TRANSVAGINAL OB ULTRASOUND TECHNIQUE: Transvaginal ultrasound was performed for complete evaluation of the gestation as well as the maternal uterus, adnexal regions, and pelvic cul-de-sac. COMPARISON:  None. FINDINGS: Intrauterine gestational sac: Single Yolk sac:  Seen Embryo:  Not seen Cardiac Activity: Not seen MSD: 7.4 mm  Subchorionic hemorrhage:  None visualized. Maternal uterus/adnexae: Trace amount of free fluid is seen in the cul-de-sac. Uterine cervix is closed. There are no dominant adnexal masses. There is 12 mm simple appearing cyst in the left adnexa, possibly a follicle. IMPRESSION: There is a gestational sac containing yolk sac within the fundus of the uterus. Findings suggest possible early IUP or failed gestation with incomplete abortion. Serial HCG estimations and short-term follow-up sonogram as warranted should be considered. Trace amount of free fluid in the cul-de-sac may be due to recent rupture of ovarian follicle. Electronically Signed   By: Ernie Avena M.D.   On: 10/25/2021 19:57   US RENAL  Result Date: 10/25/2021 CLINICAL DATA:  Dysuria abdominal pain, back pain EXAM: RENAL / URINARY TRACT ULTRASOUND COMPLETE COMPARISON:  None. FINDINGS: Right Kidney: Renal measurements: 9.4 x 5.3 x 5.5 cm = volume: 144 mL. Echogenicity within normal limits. No mass or hydronephrosis visualized. Left Kidney: Renal measurements: 9.3 x 5.1 x 4.6 cm = volume: 112.3 mL. Echogenicity within normal limits. No mass or hydronephrosis visualized. Bladder: Appears normal for degree of bladder distention. Ureteral jets are observed at both ureterovesical junctions. Other: None. IMPRESSION: No sonographic abnormality is seen in the kidneys. Electronically Signed   By: Rhae Hammock  Rathinasamy M.D.   On: 10/25/2021 19:52    Reassessment (8:25 PM)  -Results as above. -Patient informed of normal findings. -Reports pain has improved with vicodin dosing.  -Addressed questions regarding possibility of miscarriage and why pain was occurring. -Informed that UC to be sent and antibiotic sent to pharmacy.  Instructed to discontinue usage of Macrobid. -Reviewed usage of tylenol for any associated discomforts. -Discussed need for repeat US in 2 weeks. Order placed.  -Return precautions given.  -Rx for Duricef sent to pharmacy on  file.  -Discharged to home in improved condition.  Cherre Robins MSN, CNM Advanced Practice Provider, Center for [employer]

## 2021-10-25 NOTE — ED Provider Triage Note (Signed)
Emergency Medicine Provider Triage Evaluation Note ? ?Rebecca Duran , a 26 y.o. female  was evaluated in triage.  Pt complains of diagnosed with UTI 2 days ago, found out she was pregnant at the same time, she is only 3 to [redacted] weeks pregnant.  Last menstrual cycle began 09/24/2021.  She has had 1 previous pregnancy and living child.  She reports that she has been having some improvement of dysuria, but worsening abdominal, flank and back pain.  She denies significant fever, chills, she does endorse some nausea.  No vomiting. Has been taking macrobid as prescribed. Did have a small amount of blood, tissue this morning from vagina. ? ?Review of Systems  ?Positive: Back pain, flank pain, nausea ?Negative: Fever, chills ? ?Physical Exam  ?BP 133/82 (BP Location: Right Arm)   Pulse 88   Temp 98.7 ?F (37.1 ?C) (Oral)   Resp 14   SpO2 100%  ?Gen:   Awake, no distress   ?Resp:  Normal effort  ?MSK:   Moves extremities without difficulty  ?Other:   ? ?Medical Decision Making  ?Medically screening exam initiated at 6:05 PM.  Appropriate orders placed.  Aubriauna Riner was informed that the remainder of the evaluation will be completed by another provider, this initial triage assessment does not replace that evaluation, and the importance of remaining in the ED until their evaluation is complete. ? ?Spoke with MAU APP who agrees to transfer. Nurse notified of transfer. ?  ?Olene Floss, PA-C ?10/25/21 1807 ? ?

## 2021-10-25 NOTE — Progress Notes (Signed)
Jessica Emly CNM in earlier to discuss test results and d/c plan. Written and verbal d/c instructions given and understanding voiced. 

## 2021-10-26 ENCOUNTER — Ambulatory Visit: Payer: Medicaid Other | Admitting: Licensed Practical Nurse

## 2021-10-26 LAB — URINE CULTURE: Culture: >=100000 — AB

## 2021-10-27 LAB — CULTURE, OB URINE: Culture: <10000 — AB

## 2021-11-08 ENCOUNTER — Ambulatory Visit
Admission: RE | Admit: 2021-11-08 | Discharge: 2021-11-08 | Disposition: A | Discharge status: Home / Self Care | Payer: Medicaid Other | Source: Ambulatory Visit

## 2021-11-08 ENCOUNTER — Other Ambulatory Visit: Payer: Self-pay

## 2021-11-08 DIAGNOSIS — O3680X0 Pregnancy with inconclusive fetal viability, not applicable or unspecified: Secondary | ICD-10-CM | POA: Diagnosis not present

## 2021-11-08 DIAGNOSIS — Z3A01 Less than 8 weeks gestation of pregnancy: Secondary | ICD-10-CM | POA: Diagnosis not present

## 2021-11-08 DIAGNOSIS — Z3490 Encounter for supervision of normal pregnancy, unspecified, unspecified trimester: Secondary | ICD-10-CM | POA: Insufficient documentation

## 2021-11-17 ENCOUNTER — Encounter: Payer: Self-pay | Admitting: Advanced Practice Midwife

## 2021-11-17 ENCOUNTER — Other Ambulatory Visit (HOSPITAL_COMMUNITY)
Admission: RE | Admit: 2021-11-17 | Discharge: 2021-11-17 | Disposition: A | Discharge status: Home / Self Care | Payer: Medicaid Other | Source: Ambulatory Visit | Attending: Advanced Practice Midwife | Admitting: Advanced Practice Midwife

## 2021-11-17 ENCOUNTER — Ambulatory Visit (INDEPENDENT_AMBULATORY_CARE_PROVIDER_SITE_OTHER): Payer: Medicaid Other | Admitting: Advanced Practice Midwife

## 2021-11-17 VITALS — BP 118/72 | Wt 160.0 lb

## 2021-11-17 DIAGNOSIS — Z3490 Encounter for supervision of normal pregnancy, unspecified, unspecified trimester: Secondary | ICD-10-CM | POA: Insufficient documentation

## 2021-11-17 DIAGNOSIS — Z369 Encounter for antenatal screening, unspecified: Secondary | ICD-10-CM | POA: Diagnosis not present

## 2021-11-17 DIAGNOSIS — Z3481 Encounter for supervision of other normal pregnancy, first trimester: Secondary | ICD-10-CM

## 2021-11-17 DIAGNOSIS — Z130 Encounter for screening for diseases of the blood and blood-forming organs and certain disorders involving the immune mechanism: Secondary | ICD-10-CM

## 2021-11-17 DIAGNOSIS — Z113 Encounter for screening for infections with a predominantly sexual mode of transmission: Secondary | ICD-10-CM | POA: Insufficient documentation

## 2021-11-17 DIAGNOSIS — Z1159 Encounter for screening for other viral diseases: Secondary | ICD-10-CM

## 2021-11-17 DIAGNOSIS — Z3A08 8 weeks gestation of pregnancy: Secondary | ICD-10-CM | POA: Diagnosis not present

## 2021-11-17 LAB — POCT URINALYSIS DIPSTICK OB
Glucose, UA: NEGATIVE
POC,PROTEIN,UA: NEGATIVE

## 2021-11-17 NOTE — Progress Notes (Signed)
? ?New Obstetric Patient H&P  ? ? ?Chief Complaint: "Desires prenatal care" ? ? ?History of Present Illness: Patient is a 26 y.o. G2P1001 Not Hispanic or Latino female, presents with amenorrhea and positive home pregnancy test. Patient's last menstrual period was 09/21/2021 (exact date). and based on her  LMP, her EDD is Estimated Date of Delivery: 06/28/22 and her EGA is [redacted]w[redacted]d. Cycles are 5 days, regular, and occur approximately every : 28 days. Her last pap smear was 2 years ago and was no abnormalities.  ?  ?She had a urine pregnancy test which was positive 3 week(s)  ago. Her last menstrual period was normal and lasted for  5 day(s). Since her LMP she claims she has experienced breast tenderness, fatigue, nausea, vomiting, food aversion. She denies vaginal bleeding. Her past medical history is noncontributory. Her prior pregnancies are notable for  VAVD 2017 6#13oz female. ? ?Since her LMP, she admits to the use of tobacco products  no ?She claims she has lost  3  pounds since the start of her pregnancy.  ?There are cats in the home in the home  no  ?She admits close contact with children on a regular basis  yes  ?She has had chicken pox in the past no ?She has had Tuberculosis exposures, symptoms, or previously tested positive for TB   no ?Current or past history of domestic violence. no ? ?Genetic Screening/Teratology Counseling: (Includes patient, baby's father, or anyone in either family with:)  ? ?1. Patient's age >/= 69 at [facility]  no ?2. Thalassemia ([place], [place], Mediterranean, or Asian background): MCV<80  no ?3. Neural tube defect (meningomyelocele, spina bifida, anencephaly)  no ?4. Congenital heart defect  no  ?5. Down syndrome  no ?6. Tay-Sachs (Jewish, [place]  no ?7. Canavan's Disease  no ?8. Sickle cell disease or trait (African)  no  ?9. Hemophilia or other blood disorders  no  ?10. Muscular dystrophy  no  ?11. Cystic fibrosis  no  ?12. Huntington's Chorea  no  ?13. Mental retardation/autism   no ?14. Other inherited genetic or chromosomal disorder  no ?15. Maternal metabolic disorder (DM, PKU, etc)  no ?16. Patient or FOB with a child with a birth defect not listed above no  ?16a. Patient or FOB with a birth defect themselves no ?17. Recurrent pregnancy loss, or stillbirth  no  ?18. Any medications since LMP other than prenatal vitamins (include vitamins, supplements, OTC meds, drugs, alcohol)  she denies any use, however, strong odor of MJ in room ?19. Any other genetic/environmental exposure to discuss  no ? ?Infection History:  ? ?1. Lives with someone with TB or TB exposed  no  ?2. Patient or partner has history of genital herpes  no ?3. Rash or viral illness since LMP  no ?4. History of STI (GC, CT, HPV, syphilis, HIV)  remote history of chlamydia ?5. History of recent travel :  no ? ?Other pertinent information:  The pregnancy is unplanned- she was not on birth control- and is wanted. ? ? ?Review of Systems:10 point review of systems negative unless otherwise noted in HPI ? ?Past Medical History:  ?Patient Active Problem List  ? Diagnosis Date Noted  ? Supervision of normal pregnancy 11/17/2021  ?   ?Nursing Staff Provider  ?Office Location  [facility] Dating    ?Language  English Anatomy US    ?Flu Vaccine   Genetic Screen  NIPS:   ?TDaP vaccine    Hgb A1C or  ?GTT  Early : NA ?Third trimester :   ?Covid    LAB RESULTS   ?Rhogam   Blood Type     ?Feeding Plan Breast Antibody    ?Contraception  Rubella    ?Circumcision  RPR     ?Pediatrician   HBsAg     ?Support Person [facility]    ?Prenatal Classes  Varicella   ?  GBS  (For PCN allergy, check sensitivities)   ?BTL Consent     ?VBAC Consent NA Pap  2021 negative  ?  Hgb Electro    ?Pelvis Tested 6#13oz CF   ?   SMA   ?     ? ?  ? Migraine with aura and without status migrainosus, not intractable 11/03/2020  ? ? ?Past Surgical History:  ?Past Surgical History:  ?Procedure Laterality Date  ? NO PAST SURGERIES    ? ? ?Gynecologic History: Patient's  last menstrual period was 09/21/2021 (exact date). ? ?Obstetric History: G2P1001 ? ?Family History:  ?Family History  ?Problem Relation Age of Onset  ? Healthy Mother   ? Healthy Father   ? Cancer Neg Hx   ? Diabetes Neg Hx   ? Hypertension Neg Hx   ? ? ?Social History:  ?Social History  ? ?Socioeconomic History  ? Marital status: Single  ?  Spouse name: Not on file  ? Number of children: Not on file  ? Years of education: Not on file  ? Highest education level: Not on file  ?Occupational History  ? Not on file  ?Tobacco Use  ? Smoking status: Never  ? Smokeless tobacco: Never  ?Vaping Use  ? Vaping Use: Never used  ?Substance and Sexual Activity  ? Alcohol use: Yes  ?  Comment: occasionally  ? Drug use: No  ? Sexual activity: Yes  ?  Birth control/protection: None  ?Other Topics Concern  ? Not on file  ?Social History Narrative  ? Not on file  ? ?Social Determinants of Health  ? ?Financial Resource Strain: Not on file  ?Food Insecurity: Not on file  ?Transportation Needs: Not on file  ?Physical Activity: Not on file  ?Stress: Not on file  ?Social Connections: Not on file  ?Intimate Partner Violence: Not on file  ? ? ?Allergies:  ?No Known Allergies ? ?Medications: ?Prior to Admission medications   ?Not on File  ? ? ?Physical Exam ?Vitals: Weight 160 lb (72.6 kg), last menstrual period 09/21/2021. ? ?General: NAD ?HEENT: normocephalic, anicteric ?Thyroid: no enlargement, no palpable nodules ?Pulmonary: No increased work of breathing, CTAB ?Cardiovascular: RRR, distal pulses 2+ ?Abdomen: NABS, soft, non-tender, non-distended.  Umbilicus without lesions.  No hepatomegaly, splenomegaly or masses palpable. No evidence of hernia  ?Genitourinary: ? External: Normal external female genitalia.  Normal urethral meatus, normal  Bartholin's and Skene's glands.   ? Vagina: Normal vaginal mucosa, no evidence of prolapse.   ?Extremities: no edema, erythema, or tenderness ?Neurologic: Grossly intact ?Psychiatric: mood appropriate,  affect full ? ? ?The following were addressed during this visit: ? ?Breastfeeding Education ?- Early initiation of breastfeeding  ?  Comments: Keeps milk supply adequate, helps contract uterus and slow bleeding, and early milk is the perfect first food and is easy to digest. ? ? ?- The importance of exclusive breastfeeding  ?  Comments: Provides antibodies, Lower risk of breast and ovarian cancers, and type-2 diabetes,Helps your body recover, Reduced chance of SIDS. ? ? ?- Risks of giving your baby anything other than breast milk if you  are breastfeeding  ?  Comments: Make the baby less content with breastfeeds, may make my baby more susceptible to illness, and may reduce my milk supply. ? ? ?- The importance of early skin-to-skin contact  ?  Comments:  ?Keeps baby warm and secure, helps keep baby?s blood sugar up and breathing steady, easier to bond and breastfeed, and helps calm baby. ? ?- Rooming-in on a 24-hour basis  ?  Comments: Easier to learn baby's feeding cues, easier to bond and get to know each other, and encourages milk production. ? ? ?- Feeding on demand or baby-led feeding  ?  Comments: Helps prevent breastfeeding complications, helps bring in good milk supply, prevents under or overfeeding, and helps baby feel content and satisfied ? ? ?- Frequent feeding to help assure optimal milk production  ?  Comments: Making a full supply of milk requires frequent removal of milk from breasts, infant will eat 8-12 times in 24 hours, if separated from infant use breast massage, hand expression and/ or pumping to remove milk from breasts. ? ? ?- Effective positioning and attachment  ?  Comments: Helps my baby to get enough breast milk, helps to produce an adequate milk supply, and helps prevent nipple pain and damage ? ? ?- Exclusive breastfeeding for the first 6 months  ?  Comments: Builds a healthy milk supply and keeps it up, protects baby from sickness and disease, and breastmilk has everything your baby  needs for the first 6 months. ? ?- Individualized Education  ?  Comments: Contraindications to breastfeeding and other special medical conditions ?She has experience breastfeeding her first baby. ? ? ? ?

## 2021-11-17 NOTE — Patient Instructions (Signed)
Exercise During Pregnancy ?Exercise is an important part of being healthy for people of all ages. Exercise improves the function of your heart and lungs and helps you maintain strength, flexibility, and a healthy body weight. Exercise also boosts energy levels and elevates mood. ?Most women should exercise regularly during pregnancy. Exercise routines may need to change as your pregnancy progresses. In rare cases, women with certain medical conditions or complications may be asked to limit or avoid exercise during pregnancy. Your health care provider will give you information on what will work for you. ?How does this affect me? ?Along with maintaining general strength and flexibility, exercising during pregnancy can help: ?Keep strength in muscles that are used during labor and childbirth. ?Decrease low back pain or symptoms of depression. ?Control weight gain during pregnancy. ?Reduce the risk of needing insulin if you develop diabetes during pregnancy. ?Decrease the risk of cesarean delivery. ?Speed up your recovery after giving birth. ?Relieve constipation. ?How does this affect my baby? ?Exercise can help you have a healthy pregnancy. Exercise does not cause early (premature) birth. It will not cause your baby to weigh less at birth. ?What exercises can I do? ?Many exercises are safe for you to do during pregnancy. Do a variety of exercises that safely increase your heart and breathing rates and help you build and maintain muscle strength. Do exercises exactly as told by your health care provider. You may do these exercises: ?Walking. ?Swimming. ?Water aerobics. ?Riding a stationary bike. ?Modified yoga or Pilates. Tell your instructor that you are pregnant. Avoid overstretching, and avoid lying on your back for long periods of time. ?Running or jogging. Choose this type of exercise only if: ?You ran or jogged regularly before your pregnancy. ?You can run or jog and still talk in complete sentences. ?What  exercises should I avoid? ?You may be told to limit high-intensity exercise depending on your level of fitness and whether you exercised regularly before you were pregnant. You can tell that you are exercising at a high intensity if you are breathing much harder and faster and cannot hold a conversation while exercising. You must avoid: ?Contact sports. ?Activities that put you at risk for falling on or being hit in the belly, such as downhill skiing, waterskiing, surfing, rock climbing, cycling, gymnastics, and horseback riding. ?Scuba diving. ?Skydiving. ?Hot yoga or hot Pilates. These activities take place in a room that is heated to high temperatures. ?Jogging or running, unless you jogged or ran regularly before you were pregnant. While jogging or running, you should always be able to talk in full sentences. Do not run or jog so fast that you are unable to have a conversation. ?Do not exercise at more than 6,000 feet above sea level (high elevation) if you are not used to exercising at high elevation. ?How do I exercise in a safe way? ? ?Avoid overheating. Do not exercise in very high temperatures. ?Wear loose-fitting, breathable clothes. ?Avoid dehydration. Drink enough fluid before, during, and after exercise to keep your urine pale yellow. ?Avoid overstretching. Because of hormone changes during pregnancy, it is easy to overstretch muscles, tendons, and ligaments. ?Start slowly and ask your health care provider to recommend the types of exercise that are safe for you. ?Do not exercise to lose weight. ?Wear a sports bra to support your breasts. ?Avoid standing still or lying flat on your back as much as you can. ?Follow these instructions at home: ?Exercise on most days or all days of the week. Try to   exercise for 30 minutes a day, 5 days a week, unless your health care provider tells you not to. ?If you actively exercised before your pregnancy and you are healthy, your health care provider may tell you to  continue to do moderate-intensity to high-intensity exercise. ?If you are just starting to exercise or did not exercise much before your pregnancy, your health care provider may tell you to do low-intensity to moderate-intensity exercise. ?Questions to ask your health care provider ?Is exercise safe for me? ?What are signs that I should stop exercising? ?Does my health condition mean that I should not exercise during pregnancy? ?When should I avoid exercising during pregnancy? ?Stop exercising and contact a health care provider if: ?You have any unusual symptoms such as: ?Mild contractions of the uterus or cramps in the abdomen. ?A dizzy feeling that does not go away when you rest. ?Stop exercising and get help right away if: ?You have any unusual symptoms such as: ?Sudden, severe pain in your low back or your belly. ?Regular, painful contractions of your uterus. ?Chest pain. ?Bleeding or fluid leaking from your vagina. ?Shortness of breath. ?Headache. ?Pain and swelling of your calves. ?Summary ?Most women should exercise regularly throughout pregnancy. In rare cases, women with certain medical conditions or complications may be asked to limit or avoid exercise during pregnancy. ?Do not exercise to lose weight during pregnancy. ?Your health care provider will tell you what level of physical activity is right for you. ?Stop exercising and contact a health care provider if you have unusual symptoms, such as mild contractions or dizziness. ?This information is not intended to replace advice given to you by your health care provider. Make sure you discuss any questions you have with your health care provider. ?Document Revised: 03/23/2020 Document Reviewed: 03/23/2020 ?Elsevier Patient Education ? 2022 [employer] ?Eating Plan for Pregnant Women ?While you are pregnant, your body requires additional nutrition to help support your growing baby. You also have a higher need for some vitamins and minerals, such as folic  acid, calcium, iron, and vitamin D. Eating a healthy, well-balanced diet is very important for your health and your baby's health. Your need for extra calories varies over the course of your pregnancy. Pregnancy is divided into three trimesters, with each trimester lasting 3 months. For most women, it is recommended to consume: ?150 extra calories a day during the first trimester. ?300 extra calories a day during the second trimester. ?300 extra calories a day during the third trimester. ?What are tips for following this plan? ?Cooking ?Practice good food safety and cleanliness. Wash your hands before you eat and after you prepare raw meat. Wash all fruits and vegetables well before peeling or eating. Taking these actions can help to prevent foodborne illnesses that can be very dangerous to your baby, such as listeriosis. Ask your health care provider for more information about listeriosis. ?Make sure that all meats, poultry, and eggs are cooked to food-safe temperatures or "well-done." ?Meal planning ? ?Eat a variety of foods (especially fruits and vegetables) to get a full range of vitamins and minerals. ?Two or more servings of fish are recommended each week in order to get the most benefits from omega-3 fatty acids that are found in seafood. Choose fish that are lower in mercury, such as salmon and pollock. ?Limit your overall intake of foods that have "empty calories." These are foods that have little nutritional value, such as sweets, desserts, candies, and sugar-sweetened beverages. ?Drinks that contain caffeine are okay   to drink, but it is better to avoid caffeine. Keep your total caffeine intake to less than 200 mg each day (which is 12 oz or 355 mL of coffee, tea, or soda) or the limit as told by your health care provider. ?General information ?Do not try to lose weight or go on a diet during pregnancy. ?Take a prenatal vitamin to help meet your additional vitamin and mineral needs during pregnancy,  specifically for folic acid, iron, calcium, and vitamin D. ?Remember to stay active. Ask your health care provider what types of exercise and activities are safe for you. ?What does 150 extra calories look like? ?H

## 2021-11-18 LAB — URINE DRUG PANEL 7
Amphetamines, Urine: NEGATIVE ng/mL
Barbiturate Quant, Ur: NEGATIVE ng/mL
Benzodiazepine Quant, Ur: NEGATIVE ng/mL
Cannabinoid Quant, Ur: NEGATIVE ng/mL
Cocaine (Metab.): NEGATIVE ng/mL
Opiate Quant, Ur: NEGATIVE ng/mL
PCP Quant, Ur: NEGATIVE ng/mL

## 2021-11-20 LAB — CERVICOVAGINAL ANCILLARY ONLY
Chlamydia: NEGATIVE
Comment: NEGATIVE
Comment: NEGATIVE
Comment: NORMAL
Neisseria Gonorrhea: NEGATIVE
Trichomonas: NEGATIVE

## 2021-11-20 LAB — URINE CULTURE: Organism ID, Bacteria: NO GROWTH

## 2021-11-30 ENCOUNTER — Other Ambulatory Visit: Payer: Self-pay | Admitting: Advanced Practice Midwife

## 2021-11-30 ENCOUNTER — Ambulatory Visit (INDEPENDENT_AMBULATORY_CARE_PROVIDER_SITE_OTHER): Payer: Medicaid Other

## 2021-11-30 DIAGNOSIS — Z3481 Encounter for supervision of other normal pregnancy, first trimester: Secondary | ICD-10-CM

## 2021-11-30 DIAGNOSIS — Z3A10 10 weeks gestation of pregnancy: Secondary | ICD-10-CM | POA: Diagnosis not present

## 2021-11-30 DIAGNOSIS — Z369 Encounter for antenatal screening, unspecified: Secondary | ICD-10-CM

## 2021-12-01 ENCOUNTER — Ambulatory Visit (INDEPENDENT_AMBULATORY_CARE_PROVIDER_SITE_OTHER): Payer: Medicaid Other | Admitting: Licensed Practical Nurse

## 2021-12-01 ENCOUNTER — Encounter: Payer: Self-pay | Admitting: Licensed Practical Nurse

## 2021-12-01 DIAGNOSIS — Z113 Encounter for screening for infections with a predominantly sexual mode of transmission: Secondary | ICD-10-CM | POA: Diagnosis not present

## 2021-12-01 DIAGNOSIS — Z130 Encounter for screening for diseases of the blood and blood-forming organs and certain disorders involving the immune mechanism: Secondary | ICD-10-CM | POA: Diagnosis not present

## 2021-12-01 DIAGNOSIS — Z369 Encounter for antenatal screening, unspecified: Secondary | ICD-10-CM | POA: Diagnosis not present

## 2021-12-01 DIAGNOSIS — Z1159 Encounter for screening for other viral diseases: Secondary | ICD-10-CM

## 2021-12-01 DIAGNOSIS — Z3481 Encounter for supervision of other normal pregnancy, first trimester: Secondary | ICD-10-CM | POA: Diagnosis not present

## 2021-12-01 LAB — POCT URINALYSIS DIPSTICK OB: Glucose, UA: NEGATIVE

## 2021-12-01 NOTE — Addendum Note (Signed)
Addended by: Donnetta Hail on: 12/01/2021 11:37 AM ? ? Modules accepted: Orders ? ?

## 2021-12-01 NOTE — Progress Notes (Signed)
Routine Prenatal Care Visit ? ?Subjective  ?Rebecca Duran is a 26 y.o. G2P1001 at [redacted]w[redacted]d being seen today for ongoing prenatal care.  She is currently monitored for the following issues for this low-risk pregnancy and has Migraine with aura and without status migrainosus, not intractable and Supervision of normal pregnancy on their problem list.  ?----------------------------------------------------------------------------------- ?Patient reports nausea.  Takes Benadryl for allergies, seems to help the nausea. Plans to change jobs and work in [place]. Had [place] yesterday, report not resulted yet.  ? .  .  Movement: Absent. Leaking Fluid denies.  ?----------------------------------------------------------------------------------- ?The following portions of the patient's history were reviewed and updated as appropriate: allergies, current medications, past family history, past medical history, past social history, past surgical history and problem list. Problem list updated. ? ?Objective  ?Blood pressure 100/70, weight 160 lb (72.6 kg), last menstrual period 09/21/2021. ?Pregravid weight 163 lb (73.9 kg) Total Weight Gain -3 lb (-1.361 kg) ?Urinalysis: Urine Protein    Urine Glucose   ? ?Fetal Status: Fetal Heart Rate (bpm): 172   Movement: Absent    ? ?General:  Alert, oriented and cooperative. Patient is in no acute distress.  ?Skin: Skin is warm and dry. No rash noted.   ?Cardiovascular: Normal heart rate noted  ?Respiratory: Normal respiratory effort, no problems with respiration noted  ?Abdomen: Soft, gravid, appropriate for gestational age.       ?Pelvic:  Cervical exam deferred        ?Extremities: Normal range of motion.     ?Mental Status: Normal mood and affect. Normal behavior. Normal judgment and thought content.  ? ?Assessment  ? ?26 y.o. G2P1001 at [redacted]w[redacted]d by  06/28/2022, by Last Menstrual Period presenting for routine prenatal visit ? ?Plan  ? ?[id number] Problems (from 10/25/21 to present)   ? ? Problem  Noted Resolved  ? Supervision of normal pregnancy 11/17/2021 by Tresea Mall, CNM No  ? Overview Signed 11/17/2021 10:27 AM by Tresea Mall, CNM  ?   ?Nursing Staff Provider  ?Office Location  [facility] Dating    ?Language  English Anatomy US    ?Flu Vaccine   Genetic Screen  NIPS:   ?TDaP vaccine    Hgb A1C or  ?GTT Early : NA ?Third trimester :   ?Covid    LAB RESULTS   ?Rhogam   Blood Type     ?Feeding Plan Breast Antibody    ?Contraception  Rubella    ?Circumcision  RPR     ?Pediatrician   HBsAg     ?Support Person [place] [facility]    ?Prenatal Classes  Varicella   ?  GBS  (For PCN allergy, check sensitivities)   ?BTL Consent     ?VBAC Consent NA Pap  2021 negative  ?  Hgb Electro    ?Pelvis Tested 6#13oz CF   ?   SMA   ?     ? ? ?  ?  ? ?  ?  ? ?general obstetric precautions including but not limited to vaginal bleeding, contractions, leaking of fluid and fetal movement were reviewed in detail with the patient. ?Please refer to After Visit Summary for other counseling recommendations.  ? ?Return in about 4 weeks (around 12/29/2021) for ROB. ? ?NOB labs collected  ? ?Carie Caddy, CNM  ?Domingo Pulse, [facility]  ?12/01/21  ?11:32 AM  ? ? ?

## 2021-12-01 NOTE — Progress Notes (Signed)
ROB- no concerns 

## 2021-12-04 LAB — CBC/D/PLT+RPR+RH+ABO+RUBIGG...
Antibody Screen: NEGATIVE
Basophils Absolute: 0.1 10*3/uL (ref 0.0–0.2)
Basos: 1 %
EOS (ABSOLUTE): 0.2 10*3/uL (ref 0.0–0.4)
Eos: 1 %
HCV Ab: NONREACTIVE
HIV Screen 4th Generation wRfx: NONREACTIVE
Hematocrit: 41 % (ref 34.0–46.6)
Hemoglobin: 12.6 g/dL (ref 11.1–15.9)
Hepatitis B Surface Ag: NEGATIVE
Immature Grans (Abs): 0 10*3/uL (ref 0.0–0.1)
Immature Granulocytes: 0 %
Lymphocytes Absolute: 1.9 10*3/uL (ref 0.7–3.1)
Lymphs: 16 %
MCH: 25.9 pg — ABNORMAL LOW (ref 26.6–33.0)
MCHC: 30.7 g/dL — ABNORMAL LOW (ref 31.5–35.7)
MCV: 84 fL (ref 79–97)
Monocytes Absolute: 1 10*3/uL — ABNORMAL HIGH (ref 0.1–0.9)
Monocytes: 8 %
Neutrophils Absolute: 8.9 10*3/uL — ABNORMAL HIGH (ref 1.4–7.0)
Neutrophils: 74 %
Platelets: 406 10*3/uL (ref 150–450)
RBC: 4.86 x10E6/uL (ref 3.77–5.28)
RDW: 13.7 % (ref 11.7–15.4)
RPR Ser Ql: NONREACTIVE
Rh Factor: POSITIVE
Rubella Antibodies, IGG: 3.65 index (ref >0.99)
Varicella zoster IgG: 247 index (ref >165)
WBC: 12.1 10*3/uL — ABNORMAL HIGH (ref 3.4–10.8)

## 2021-12-04 LAB — HGB FRACTIONATION CASCADE
Hgb A2: 2.7 % (ref 1.8–3.2)
Hgb A: 97.3 % (ref 96.4–98.8)
Hgb F: 0 % (ref 0.0–2.0)
Hgb S: 0 %

## 2021-12-04 LAB — HCV INTERPRETATION

## 2021-12-29 ENCOUNTER — Encounter: Payer: Medicaid Other | Admitting: Obstetrics

## 2022-01-01 ENCOUNTER — Ambulatory Visit (INDEPENDENT_AMBULATORY_CARE_PROVIDER_SITE_OTHER): Payer: Medicaid Other | Admitting: Advanced Practice Midwife

## 2022-01-01 ENCOUNTER — Encounter: Payer: Self-pay | Admitting: Advanced Practice Midwife

## 2022-01-01 VITALS — BP 118/70 | Wt 162.0 lb

## 2022-01-01 DIAGNOSIS — Z3482 Encounter for supervision of other normal pregnancy, second trimester: Secondary | ICD-10-CM

## 2022-01-01 DIAGNOSIS — Z369 Encounter for antenatal screening, unspecified: Secondary | ICD-10-CM

## 2022-01-01 DIAGNOSIS — Z3A14 14 weeks gestation of pregnancy: Secondary | ICD-10-CM

## 2022-01-01 LAB — POCT URINALYSIS DIPSTICK OB
Glucose, UA: NEGATIVE
POC,PROTEIN,UA: NEGATIVE

## 2022-01-01 NOTE — Patient Instructions (Signed)

## 2022-01-01 NOTE — Addendum Note (Signed)
Addended by: Donnetta Hail on: 01/01/2022 02:12 PM ? ? Modules accepted: Orders ? ?

## 2022-01-01 NOTE — Progress Notes (Signed)
ROB- no concerns 

## 2022-01-01 NOTE — Progress Notes (Signed)
Routine Prenatal Care Visit ? ?Subjective  ?Rebecca Duran is a 26 y.o. G2P1001 at [redacted]w[redacted]d being seen today for ongoing prenatal care.  She is currently monitored for the following issues for this low-risk pregnancy and has Migraine with aura and without status migrainosus, not intractable and Supervision of normal pregnancy on their problem list.  ?----------------------------------------------------------------------------------- ?Patient reports  still having nausea along with some "good" days. The benadryl helps. Fetal moves heard on doppler and she acknowledged feeling them .   ?Contractions: Not present. Vag. Bleeding: None.   . Leaking Fluid denies.  ?----------------------------------------------------------------------------------- ?The following portions of the patient's history were reviewed and updated as appropriate: allergies, current medications, past family history, past medical history, past social history, past surgical history and problem list. Problem list updated. ? ?Objective  ?Blood pressure 118/70, weight 162 lb (73.5 kg), last menstrual period 09/21/2021. ?Pregravid weight 163 lb (73.9 kg) Total Weight Gain -1 lb (-0.454 kg) ?Urinalysis: Urine Protein    Urine Glucose   ? ?Fetal Status: Fetal Heart Rate (bpm): 163        ? ?General:  Alert, oriented and cooperative. Patient is in no acute distress.  ?Skin: Skin is warm and dry. No rash noted.   ?Cardiovascular: Normal heart rate noted  ?Respiratory: Normal respiratory effort, no problems with respiration noted  ?Abdomen: Soft, gravid, appropriate for gestational age. Pain/Pressure: Absent     ?Pelvic:  Cervical exam deferred        ?Extremities: Normal range of motion.  Edema: None  ?Mental Status: Normal mood and affect. Normal behavior. Normal judgment and thought content.  ? ?Assessment  ? ?26 y.o. G2P1001 at [redacted]w[redacted]d by  06/28/2022, by Last Menstrual Period presenting for routine prenatal visit ? ?Plan  ? ?[id number] Problems (from 10/25/21 to  present)   ? ? Problem Noted Resolved  ? Supervision of normal pregnancy 11/17/2021 by Rod Can, CNM No  ? Overview Signed 11/17/2021 10:27 AM by Rod Can, CNM  ?   ?Nursing Staff Provider  ?Office Location  [facility] Dating  By LMP c/w 10w  ?Language  English Anatomy US    ?Flu Vaccine   Genetic Screen  NIPS:   ?TDaP vaccine    Hgb A1C or  ?GTT Early : NA ?Third trimester :   ?Covid    LAB RESULTS   ?Rhogam   Blood Type     ?Feeding Plan Breast Antibody    ?Contraception  Rubella    ?Circumcision  RPR     ?Pediatrician   HBsAg     ?Support Person [place] [facility]    ?Prenatal Classes  Varicella   ?  GBS  (For PCN allergy, check sensitivities)   ?BTL Consent     ?VBAC Consent NA Pap  2021 negative  ?  Hgb Electro    ?Pelvis Tested 6#13oz CF   ?   SMA   ?     ? ? ?  ?  ? ?  ?  ? ?Preterm labor symptoms and general obstetric precautions including but not limited to vaginal bleeding, contractions, leaking of fluid and fetal movement were reviewed in detail with the patient. ?Please refer to After Visit Summary for other counseling recommendations.  ? ?Return in about 4 weeks (around 01/29/2022) for anatomy scan and rob. ? ?Rod Can, CNM ?01/01/2022 1:33 PM   ? ?

## 2022-01-24 ENCOUNTER — Encounter (HOSPITAL_COMMUNITY): Payer: Self-pay | Admitting: Obstetrics & Gynecology

## 2022-01-24 ENCOUNTER — Other Ambulatory Visit: Payer: Self-pay

## 2022-01-24 ENCOUNTER — Inpatient Hospital Stay (HOSPITAL_COMMUNITY)
Admission: AD | Admit: 2022-01-24 | Discharge: 2022-01-24 | Disposition: A | Discharge status: Home / Self Care | Payer: Medicaid Other | Attending: Obstetrics & Gynecology | Admitting: Obstetrics & Gynecology

## 2022-01-24 DIAGNOSIS — G43109 Migraine with aura, not intractable, without status migrainosus: Secondary | ICD-10-CM | POA: Diagnosis not present

## 2022-01-24 DIAGNOSIS — O99891 Other specified diseases and conditions complicating pregnancy: Secondary | ICD-10-CM | POA: Diagnosis not present

## 2022-01-24 DIAGNOSIS — Z3A17 17 weeks gestation of pregnancy: Secondary | ICD-10-CM | POA: Insufficient documentation

## 2022-01-24 LAB — URINALYSIS, ROUTINE W REFLEX MICROSCOPIC
Bilirubin Urine: NEGATIVE
Glucose, UA: NEGATIVE mg/dL
Hgb urine dipstick: NEGATIVE
Ketones, ur: NEGATIVE mg/dL
Leukocytes,Ua: NEGATIVE
Nitrite: NEGATIVE
Protein, ur: NEGATIVE mg/dL
Specific Gravity, Urine: 1.016 (ref 1.005–1.030)
pH: 6 (ref 5.0–8.0)

## 2022-01-24 MED ORDER — CYCLOBENZAPRINE HCL 10 MG PO TABS: 10.0000 mg | ORAL_TABLET | Freq: Three times a day (TID) | ORAL | 1 refills | Status: DC | PRN

## 2022-01-24 MED ORDER — CYCLOBENZAPRINE HCL 5 MG PO TABS
10.0000 mg | ORAL_TABLET | Freq: Once | ORAL | Status: AC
Start: 2022-01-24 — End: 2022-01-24
  Administered 2022-01-24: 10 mg via ORAL
  Filled 2022-01-24: qty 2

## 2022-01-24 MED ORDER — ACETAMINOPHEN 500 MG PO TABS
1000.0000 mg | ORAL_TABLET | Freq: Once | ORAL | Status: AC
  Administered 2022-01-24: 1000 mg via ORAL
  Filled 2022-01-24: qty 2

## 2022-01-24 MED ORDER — LACTATED RINGERS IV BOLUS
1000.0000 mL | Freq: Once | INTRAVENOUS | Status: AC
  Administered 2022-01-24: 1000 mL via INTRAVENOUS

## 2022-01-24 NOTE — MAU Provider Note (Signed)
History     CSN: [id number]  Arrival date and time: 01/24/22 [id number]   Event Date/Time   First Provider Initiated Contact with Patient 01/24/22 2057      Chief Complaint  Patient presents with   Headache   Abdominal Pain   Ms. Rebecca Duran is a 26 y.o. G2P1001 at [redacted]w[redacted]d who presents to MAU for migraine. Patient reports a history of migraine headaches and was diagnosed about one and a half years ago with MWA. Patient reports she worked with her PCP to change her diet and that worked to resolve her headaches. Patient states since she has been pregnant, she started having mild headaches early in pregnancy, but they did not get bad until about one week ago. Patient reports she is not able to sleep at night because of the pain. Patient states she has tried cold and warm compresses with no relief. Patient reports she took one regular strength Tylenol yesterday around 12pm, but it did not do anything. Patient states this headache started 3 days ago. Patient states the pain is always present, but it does get better and worse during the day. Patient reports taking baths and using warm compress for her head does help, but it does not take it all away. Patient states the pain starts in the back of her neck and then works its way up over her head to her forehead. Patient states the pain is currently in the front of her head, but has changed locations over the past 3 days. Patient states this feels like her previous migraine headaches, only the pain is worse.   Pt denies VB, LOF, ctx, decreased FM, vaginal discharge/odor/itching. Pt denies N/V, abdominal pain, constipation, diarrhea, or urinary problems. Pt denies fever, chills, fatigue, sweating or changes in appetite. Pt denies SOB or chest pain. Pt denies dizziness, HA, light-headedness, weakness.  Problems this pregnancy include: MWA. Allergies? NKDA Current medications/supplements? none Prenatal care provider? [facility], next appt  01/26/2022   OB History     Gravida  2   Para  1   Term  1   Preterm      AB      Living  1      SAB      IAB      Ectopic      Multiple  0   Live Births  1           History reviewed. No pertinent past medical history.  Past Surgical History:  Procedure Laterality Date   NO PAST SURGERIES      Family History  Problem Relation Age of Onset   Healthy Mother    Healthy Father    Cancer Neg Hx    Diabetes Neg Hx    Hypertension Neg Hx     Social History   Tobacco Use   Smoking status: Never   Smokeless tobacco: Never  Vaping Use   Vaping Use: Never used  Substance Use Topics   Alcohol use: Yes    Comment: occasionally   Drug use: No    Allergies: No Known Allergies  No medications prior to admission.    Review of Systems  Constitutional:  Negative for chills, diaphoresis, fatigue and fever.  Eyes:  Negative for visual disturbance.  Respiratory:  Negative for shortness of breath.   Cardiovascular:  Negative for chest pain.  Gastrointestinal:  Negative for abdominal pain, constipation, diarrhea, nausea and vomiting.  Genitourinary:  Negative for dysuria, flank pain, frequency, pelvic pain,  urgency, vaginal bleeding and vaginal discharge.  Musculoskeletal:  Positive for neck pain.  Neurological:  Positive for headaches. Negative for dizziness, weakness and light-headedness.   Physical Exam   Blood pressure 117/64, pulse (!) 102, temperature 98.9 F (37.2 C), temperature source Oral, resp. rate 17, height 5\' 4"  (1.626 m), weight 74.2 kg, last menstrual period 09/21/2021, SpO2 97 %.  Patient Vitals for the past 24 hrs:  BP Temp Temp src Pulse Resp SpO2 Height Weight  01/24/22 2031 117/64 -- -- (!) 102 17 97 % -- --  01/24/22 2007 121/72 98.9 F (37.2 C) Oral (!) 114 18 100 % 5\' 4"  (1.626 m) 74.2 kg   Physical Exam Vitals and nursing note reviewed.  Constitutional:      General: She is not in acute distress.    Appearance: Normal  appearance. She is not ill-appearing, toxic-appearing or diaphoretic.  Pulmonary:     Effort: Pulmonary effort is normal.  Neurological:     General: No focal deficit present.     Mental Status: She is alert and oriented to person, place, and time.  Psychiatric:        Mood and Affect: Mood normal.        Behavior: Behavior normal.        Thought Content: Thought content normal.        Judgment: Judgment normal.   Results for orders placed or performed during the hospital encounter of 01/24/22 (from the past 24 hour(s))  Urinalysis, Routine w reflex microscopic     Status: Abnormal   Collection Time: 01/24/22  8:05 PM  Result Value Ref Range   Color, Urine YELLOW YELLOW   APPearance HAZY (A) CLEAR   Specific Gravity, Urine 1.016 1.005 - 1.030   pH 6.0 5.0 - 8.0   Glucose, UA NEGATIVE NEGATIVE mg/dL   Hgb urine dipstick NEGATIVE NEGATIVE   Bilirubin Urine NEGATIVE NEGATIVE   Ketones, ur NEGATIVE NEGATIVE mg/dL   Protein, ur NEGATIVE NEGATIVE mg/dL   Nitrite NEGATIVE NEGATIVE   Leukocytes,Ua NEGATIVE NEGATIVE   No results found.  MAU Course  Procedures  MDM -HA 10/10 with known hx of migraines and untreated migraine x3days -UA: hazy, otherwise WNL -1L LR + 1000mg  Tylenol + 10mg  Flexeril given -after administration, pt reports HA 0/10 -pt also reported to RN abdominal cramping. Clarified with patient that abdominal cramping feels like menstrual cramps and hip pain and has been on-going since the start of her pregnancy, slightly worse in the past 1-2 weeks and abdominal cramping not present at this time. -CE: long/closed/posterior -pt discharged to home in stable condition  Orders Placed This Encounter  Procedures   Urinalysis, Routine w reflex microscopic    Standing Status:   Standing    Number of Occurrences:   1   Insert peripheral IV    Standing Status:   Standing    Number of Occurrences:   1   Meds ordered this encounter  Medications   lactated ringers bolus  1,000 mL   acetaminophen (TYLENOL) tablet 1,000 mg   cyclobenzaprine (FLEXERIL) tablet 10 mg   Assessment and Plan   1. Migraine with aura and without status migrainosus, not intractable   2. [redacted] weeks gestation of pregnancy     Allergies as of 01/24/2022   No Known Allergies      Medication List    You have not been prescribed any medications.     -pt advised to seek care with HA specialist if treating more  than one HA per week and/or returning to MAU for additional HA treatment -return MAU precautions given -pt discharged to home in stable condition  Elmyra Ricks E Teresia Myint 01/24/2022, 8:59 PM

## 2022-01-24 NOTE — MAU Note (Signed)
.  Rebecca Duran is a 26 y.o. at [redacted]w[redacted]d here in MAU reporting: migraine on left side and lower ABD cramping that started 3 days ago, lasted constantly. Pt reports light, sound and position make her migraine worse. Pt reports taking Tylenol 325mg , with no relief, patient report cautious about taking medication. Pt report the ABD cramping has been intermittent but has been more constant today, and she feels it in her hips as well. PT denies VB, LOF, abnormal discharge, and recent intercourse in the last 48 hours.   Onset of complaint: 3 days ago Pain score: 10/10 HA, ABD 7/10 Vitals:   01/24/22 2007  BP: 121/72  Pulse: (!) 114  Resp: 18  Temp: 98.9 F (37.2 C)  SpO2: 100%     FHT:160 Lab orders placed from triage:  UA

## 2022-01-24 NOTE — Discharge Instructions (Addendum)
For prevention of migraines in pregnancy: -Magnesium, 400mg  by mouth, once daily -Vitamin B2, 400mg  by mouth, once daily  For treatment of migraines in pregnancy: -take medication at the first sign of the pain of a headache, or the first sign of your aura -start with 1000mg  Tylenol (do not exceed 4000mg  of Tylenol in 24hrs), with Flexeril 10mg  -if the above regimen does not resolve your headache at all, please come to [facility] for additional treatment                        Safe Medications in Pregnancy    Acne: Benzoyl Peroxide Salicylic Acid  Backache/Headache: Tylenol: 2 regular strength every 4 hours OR              2 Extra strength every 6 hours  Colds/Coughs/Allergies: Benadryl (alcohol free) 25 mg every 6 hours as needed Breath right strips Claritin Cepacol throat lozenges Chloraseptic throat spray Cold-Eeze- up to three times per day Cough drops, alcohol free Flonase (by prescription only) Guaifenesin Mucinex Robitussin DM (plain only, alcohol free) Saline nasal spray/drops Sudafed (pseudoephedrine) & Actifed ** use only after [redacted] weeks gestation and if you do not have high blood pressure Tylenol Vicks Vaporub Zinc lozenges Zyrtec   Constipation: Colace Ducolax suppositories Fleet enema Glycerin suppositories Metamucil Milk of magnesia Miralax Senokot Smooth move tea  Diarrhea: Kaopectate Imodium A-D  *NO pepto Bismol  Hemorrhoids: Anusol Anusol HC Preparation H Tucks  Indigestion: Tums Maalox Mylanta Zantac  Pepcid  Insomnia: Benadryl (alcohol free) 25mg  every 6 hours as needed Tylenol PM Unisom, no Gelcaps  Leg Cramps: Tums MagGel  Nausea/Vomiting:  Bonine Dramamine Emetrol Ginger extract Sea bands Meclizine  Nausea medication to take during pregnancy:  Unisom (doxylamine succinate 25 mg tablets) Take one tablet daily at bedtime. If symptoms are not adequately controlled, the dose can be increased to a maximum recommended  dose of two tablets daily (1/2 tablet in the morning, 1/2 tablet mid-afternoon and one at bedtime). Vitamin B6 100mg  tablets. Take one tablet twice a day (up to 200 mg per day).  Skin Rashes: Aveeno products Benadryl cream or 25mg  every 6 hours as needed Calamine Lotion 1% cortisone cream  Yeast infection: Gyne-lotrimin 7 Monistat 7   **If taking multiple medications, please check labels to avoid duplicating the same active ingredients **take medication as directed on the label ** Do not exceed 4000 mg of tylenol in 24 hours **Do not take medications that contain aspirin or ibuprofen

## 2022-01-26 ENCOUNTER — Ambulatory Visit
Admission: RE | Admit: 2022-01-26 | Discharge: 2022-01-26 | Disposition: A | Discharge status: Home / Self Care | Payer: Medicaid Other | Source: Ambulatory Visit | Attending: Advanced Practice Midwife | Admitting: Advanced Practice Midwife

## 2022-01-26 DIAGNOSIS — Z3482 Encounter for supervision of other normal pregnancy, second trimester: Secondary | ICD-10-CM

## 2022-01-26 DIAGNOSIS — Z3689 Encounter for other specified antenatal screening: Secondary | ICD-10-CM | POA: Diagnosis not present

## 2022-01-26 DIAGNOSIS — Z3A18 18 weeks gestation of pregnancy: Secondary | ICD-10-CM | POA: Insufficient documentation

## 2022-01-26 DIAGNOSIS — Z3492 Encounter for supervision of normal pregnancy, unspecified, second trimester: Secondary | ICD-10-CM | POA: Diagnosis not present

## 2022-01-26 DIAGNOSIS — Z369 Encounter for antenatal screening, unspecified: Secondary | ICD-10-CM

## 2022-01-29 ENCOUNTER — Ambulatory Visit (INDEPENDENT_AMBULATORY_CARE_PROVIDER_SITE_OTHER): Payer: Medicaid Other | Admitting: Obstetrics

## 2022-01-29 ENCOUNTER — Encounter: Payer: Self-pay | Admitting: Obstetrics

## 2022-01-29 VITALS — BP 106/64 | Wt 165.6 lb

## 2022-01-29 DIAGNOSIS — Z3482 Encounter for supervision of other normal pregnancy, second trimester: Secondary | ICD-10-CM

## 2022-01-29 DIAGNOSIS — Z3A18 18 weeks gestation of pregnancy: Secondary | ICD-10-CM

## 2022-01-29 LAB — POCT URINALYSIS DIPSTICK OB
Glucose, UA: NEGATIVE
POC,PROTEIN,UA: NEGATIVE

## 2022-01-29 NOTE — Progress Notes (Signed)
Routine Prenatal Care Visit  Subjective  Rebecca Duran is a 26 y.o. G2P1001 at [redacted]w[redacted]d being seen today for ongoing prenatal care.  She is currently monitored for the following issues for this low-risk pregnancy and has Migraine with aura and without status migrainosus, not intractable and Supervision of normal pregnancy on their problem list.  ----------------------------------------------------------------------------------- Patient reports occasional cramping, but no bleedign or LOF.  She does not yet know the gender- having a reveal party this coming weekend. Plans to breastfeed- nursed her first baby for 4 months. Contractions: Not present. Vag. Bleeding: None.  Movement: Present. Leaking Fluid denies. Her anaotmy scan shows a marginal previa. Will rescan at [redacted] weeks gestation. The following portions of the patient's history were reviewed and updated as appropriate: allergies, current medications, past family history, past medical history, past social history, past surgical history and problem list. Problem list updated.  Objective  Blood pressure 106/64, weight 165 lb 9.6 oz (75.1 kg), last menstrual period 09/21/2021. Pregravid weight 163 lb (73.9 kg) Total Weight Gain 2 lb 9.6 oz (1.179 kg) Urinalysis: Urine Protein Negative  Urine Glucose Negative  Fetal Status:     Movement: Present     General:  Alert, oriented and cooperative. Patient is in no acute distress.  Skin: Skin is warm and dry. No rash noted.   Cardiovascular: Normal heart rate noted  Respiratory: Normal respiratory effort, no problems with respiration noted  Abdomen: Soft, gravid, appropriate for gestational age. Pain/Pressure: Present     Pelvic:  Cervical exam deferred        Extremities: Normal range of motion.     Mental Status: Normal mood and affect. Normal behavior. Normal judgment and thought content.   Assessment   26 y.o. G2P1001 at [redacted]w[redacted]d by  06/28/2022, by Last Menstrual Period presenting for routine  prenatal visit  Plan   [email or web address] Problems (from 10/25/21 to present)    Problem Noted Resolved   Supervision of normal pregnancy 11/17/2021 by Tresea Mall, CNM No   Overview Addendum 01/29/2022  1:36 PM by Mirna Mires, CNM     Nursing Staff Provider  Office Location  [facility] Dating  By LMP c/w 10w  Language  English Anatomy US    Flu Vaccine   Genetic Screen  NIPS:   TDaP vaccine    Hgb A1C or  GTT Early : NA Third trimester :   Covid    LAB RESULTS   Rhogam   Blood Type A/Positive/-- (04/14 1130)   Feeding Plan Breast Antibody Negative (04/14 1130)  Contraception  Rubella 3.65 (04/14 1130)  Circumcision  RPR Non Reactive (04/14 1130)   Pediatrician   HBsAg Negative (04/14 1130)   Support Person Dajuan HIV Non Reactive (04/14 1130)  Prenatal Classes  Varicella     GBS  (For PCN allergy, check sensitivities)   BTL Consent     VBAC Consent NA Pap  2021 negative    Hgb Electro    Pelvis Tested 6#13oz CF      SMA                   Preterm labor symptoms and general obstetric precautions including but not limited to vaginal bleeding, contractions, leaking of fluid and fetal movement were reviewed in detail with the patient. Please refer to After Visit Summary for other counseling recommendations.   No follow-ups on file.  Mirna Mires, CNM  01/29/2022 1:36 PM

## 2022-01-29 NOTE — Progress Notes (Signed)
Patient reports cramping that can last 15-58min in her lower abdomen.

## 2022-02-26 ENCOUNTER — Ambulatory Visit (INDEPENDENT_AMBULATORY_CARE_PROVIDER_SITE_OTHER): Payer: Medicaid Other | Admitting: Obstetrics

## 2022-02-26 VITALS — BP 118/70 | Wt 172.0 lb

## 2022-02-26 DIAGNOSIS — Z3482 Encounter for supervision of other normal pregnancy, second trimester: Secondary | ICD-10-CM

## 2022-02-26 DIAGNOSIS — Z3A22 22 weeks gestation of pregnancy: Secondary | ICD-10-CM

## 2022-02-26 NOTE — Progress Notes (Signed)
Routine Prenatal Care Visit  Subjective  Rebecca Duran is a 26 y.o. G2P1001 at [redacted]w[redacted]d being seen today for ongoing prenatal care.  She is currently monitored for the following issues for this low-risk pregnancy and has Migraine with aura and without status migrainosus, not intractable and Supervision of normal pregnancy on their problem list.  ----------------------------------------------------------------------------------- Patient reports no complaints.   Contractions: Not present. Vag. Bleeding: None.   . Leaking Fluid denies.  ----------------------------------------------------------------------------------- The following portions of the patient's history were reviewed and updated as appropriate: allergies, current medications, past family history, past medical history, past social history, past surgical history and problem list. Problem list updated.  Objective  Blood pressure 118/70, weight 172 lb (78 kg), last menstrual period 09/21/2021. Pregravid weight 163 lb (73.9 kg) Total Weight Gain 9 lb (4.082 kg) Urinalysis: Urine Protein    Urine Glucose    Fetal Status:           General:  Alert, oriented and cooperative. Patient is in no acute distress.  Skin: Skin is warm and dry. No rash noted.   Cardiovascular: Normal heart rate noted  Respiratory: Normal respiratory effort, no problems with respiration noted  Abdomen: Soft, gravid, appropriate for gestational age. Pain/Pressure: Absent     Pelvic:  Cervical exam deferred        Extremities: Normal range of motion.     Mental Status: Normal mood and affect. Normal behavior. Normal judgment and thought content.   Assessment   26 y.o. G2P1001 at [redacted]w[redacted]d by  06/28/2022, by Last Menstrual Period presenting for routine prenatal visit  Plan   [email or web address] Problems (from 10/25/21 to present)    Problem Noted Resolved   Supervision of normal pregnancy 11/17/2021 by Tresea Mall, CNM No   Overview Addendum 01/29/2022  1:36 PM by Mirna Mires, CNM     Nursing Staff Provider  Office Location  [facility] Dating  By LMP c/w 10w  Language  English Anatomy US    Flu Vaccine   Genetic Screen  NIPS:   TDaP vaccine    Hgb A1C or  GTT Early : NA Third trimester :   Covid    LAB RESULTS   Rhogam   Blood Type A/Positive/-- (04/14 1130)   Feeding Plan Breast Antibody Negative (04/14 1130)  Contraception  Rubella 3.65 (04/14 1130)  Circumcision  RPR Non Reactive (04/14 1130)   Pediatrician   HBsAg Negative (04/14 1130)   Support Person Dajuan HIV Non Reactive (04/14 1130)  Prenatal Classes  Varicella     GBS  (For PCN allergy, check sensitivities)   BTL Consent     VBAC Consent NA Pap  2021 negative    Hgb Electro    Pelvis Tested 6#13oz CF      SMA                   Preterm labor symptoms and general obstetric precautions including but not limited to vaginal bleeding, contractions, leaking of fluid and fetal movement were reviewed in detail with the patient. Please refer to After Visit Summary for other counseling recommendations.   No follow-ups on file.  Mirna Mires, CNM  02/26/2022 1:22 PM

## 2022-02-26 NOTE — Progress Notes (Signed)
No vb. No lof.  

## 2022-02-27 ENCOUNTER — Telehealth: Payer: Self-pay

## 2022-02-27 NOTE — Telephone Encounter (Signed)
Spoke w/patient. She reports a green mucus like clump of discharge this morning with a little itching. Denies burning or odor, no new sexual partners or concern for STI. She does change soaps often. Advised to use dove sensitive soaps and fragrance free deodorant. May use Monistat 7 (no 3 day monistat). Offered appointment for self swab if she wanted to be sure before treating. Patient advised she is in [place] and will go to an Urgent Care. Advised MAU (Maternal Assessment Unit) is available in the [facility].

## 2022-02-27 NOTE — Telephone Encounter (Signed)
Patient is returning missed call. Please advise 

## 2022-02-27 NOTE — Telephone Encounter (Signed)
Left voicemail to return call. 

## 2022-02-27 NOTE — Telephone Encounter (Signed)
TRIAGE VOICEMAIL: Patient reports she is [redacted] weeks pregnant and was just seen yesterday. She noticed some discharge this morning. Inquiring if she was tested for any infections or if she needs to go to urgent care. [phone].

## 2022-03-26 ENCOUNTER — Ambulatory Visit (INDEPENDENT_AMBULATORY_CARE_PROVIDER_SITE_OTHER): Payer: Medicaid Other | Admitting: Obstetrics & Gynecology

## 2022-03-26 ENCOUNTER — Encounter: Payer: Self-pay | Admitting: Obstetrics & Gynecology

## 2022-03-26 VITALS — BP 120/70 | Wt 179.0 lb

## 2022-03-26 DIAGNOSIS — Z3482 Encounter for supervision of other normal pregnancy, second trimester: Secondary | ICD-10-CM

## 2022-03-26 DIAGNOSIS — Z3A26 26 weeks gestation of pregnancy: Secondary | ICD-10-CM

## 2022-03-26 DIAGNOSIS — O4420 Partial placenta previa NOS or without hemorrhage, unspecified trimester: Secondary | ICD-10-CM

## 2022-03-26 DIAGNOSIS — F5089 Other specified eating disorder: Secondary | ICD-10-CM

## 2022-03-26 MED ORDER — FERROUS FUMARATE 325 (106 FE) MG PO TABS
1.0000 | ORAL_TABLET | Freq: Every day | ORAL | 6 refills | Status: DC
Start: 2022-03-26 — End: 2022-05-01

## 2022-03-26 NOTE — Progress Notes (Signed)
   PRENATAL VISIT NOTE  Subjective:  Rebecca Duran is a 26 y.o. G60P1001 (59 yo son) at [redacted]w[redacted]d being seen today for ongoing prenatal care.  She is currently monitored for the following issues for this low-risk pregnancy and has Migraine with aura and without status migrainosus, not intractable and Supervision of normal pregnancy on their problem list.  Patient reports  that she has been craving eating toilet paper just like she did with her first pregnancy. This started about 2 months ago .  Contractions: Not present. Vag. Bleeding: None.  Movement: Present. Denies leaking of fluid.   The following portions of the patient's history were reviewed and updated as appropriate: allergies, current medications, past family history, past medical history, past social history, past surgical history and problem list.   Objective:   Vitals:   03/26/22 1428  BP: 120/70  Weight: 179 lb (81.2 kg)    Fetal Status:     Movement: Present     General:  Alert, oriented and cooperative. Patient is in no acute distress.  Skin: Skin is warm and dry. No rash noted.   Cardiovascular: Normal heart rate noted  Respiratory: Normal respiratory effort, no problems with respiration noted  Abdomen: Soft, gravid, appropriate for gestational age.  Pain/Pressure: Present     Pelvic: Cervical exam deferred        Extremities: Normal range of motion.     Mental Status: Normal mood and affect. Normal behavior. Normal judgment and thought content.   Assessment and Plan:  Pregnancy: G2P1001 at [redacted]w[redacted]d 1. Encounter for supervision of other normal pregnancy in second trimester - 1 hour GTT and labs at next visit  2. [redacted] weeks gestation of pregnancy  3. Pica I have prescribed iron and will await CBC at next visit.  4. Marginal placenta previa- I have ordered a follow up ultrasound  Preterm labor symptoms and general obstetric precautions including but not limited to vaginal bleeding, contractions, leaking of fluid and  fetal movement were reviewed in detail with the patient. Please refer to After Visit Summary for other counseling recommendations.   Return in about 2 weeks (around 04/09/2022) for ROB and 1 hour glucola.  No future appointments.  Allie Bossier, MD

## 2022-03-27 NOTE — Addendum Note (Signed)
Addended by: Kathlene Cote on: 03/27/2022 11:11 AM   Modules accepted: Orders

## 2022-03-27 NOTE — Addendum Note (Signed)
Addended by: Kathlene Cote on: 03/27/2022 11:14 AM   Modules accepted: Orders

## 2022-04-11 ENCOUNTER — Other Ambulatory Visit: Payer: Medicaid Other

## 2022-04-11 ENCOUNTER — Encounter: Payer: Medicaid Other | Admitting: Obstetrics

## 2022-04-11 ENCOUNTER — Telehealth: Payer: Self-pay | Admitting: Obstetrics

## 2022-04-11 NOTE — Telephone Encounter (Signed)
Reached out to pt to reschedule appt that was scheduled for 8/23 at 9:40 (lab) and 10:15 (ROB) with MMF. Could not leave message.  Phone went to an immediate busy signal.

## 2022-04-12 ENCOUNTER — Encounter: Payer: Self-pay | Admitting: Obstetrics

## 2022-04-12 NOTE — Telephone Encounter (Signed)
Reached out to pt (x2) to reschedule appt that was scheduled for 8/23 at 9/40 (lab) and 10:15 (ROB ) with MMF. Could not leave message.  Phone went to an immediate busy signal.

## 2022-04-17 NOTE — Telephone Encounter (Signed)
I contacted patient via phone. Patient's states she had an family emergency and her phone was broken. I was able to rescheduled to 04/27/22 for labs and ROB visit

## 2022-04-20 ENCOUNTER — Ambulatory Visit: Payer: Medicaid Other | Attending: Obstetrics & Gynecology

## 2022-04-20 ENCOUNTER — Ambulatory Visit: Payer: Medicaid Other | Admitting: *Deleted

## 2022-04-20 ENCOUNTER — Encounter: Payer: Self-pay | Admitting: *Deleted

## 2022-04-20 VITALS — BP 129/62 | HR 109

## 2022-04-20 DIAGNOSIS — O4420 Partial placenta previa NOS or without hemorrhage, unspecified trimester: Secondary | ICD-10-CM | POA: Insufficient documentation

## 2022-04-20 DIAGNOSIS — O4443 Low lying placenta NOS or without hemorrhage, third trimester: Secondary | ICD-10-CM | POA: Diagnosis not present

## 2022-04-20 DIAGNOSIS — O4403 Placenta previa specified as without hemorrhage, third trimester: Secondary | ICD-10-CM | POA: Diagnosis present

## 2022-04-20 DIAGNOSIS — Z3A30 30 weeks gestation of pregnancy: Secondary | ICD-10-CM | POA: Diagnosis not present

## 2022-04-27 ENCOUNTER — Ambulatory Visit (INDEPENDENT_AMBULATORY_CARE_PROVIDER_SITE_OTHER): Payer: Medicaid Other | Admitting: Obstetrics

## 2022-04-27 ENCOUNTER — Other Ambulatory Visit: Payer: Medicaid Other

## 2022-04-27 VITALS — BP 126/80 | Wt 189.0 lb

## 2022-04-27 DIAGNOSIS — Z3482 Encounter for supervision of other normal pregnancy, second trimester: Secondary | ICD-10-CM

## 2022-04-27 DIAGNOSIS — Z3A31 31 weeks gestation of pregnancy: Secondary | ICD-10-CM | POA: Diagnosis not present

## 2022-04-27 DIAGNOSIS — F5089 Other specified eating disorder: Secondary | ICD-10-CM

## 2022-04-27 NOTE — Progress Notes (Signed)
Routine Prenatal Care Visit  Subjective  Rebecca Duran is a 26 y.o. G2P1001 at [redacted]w[redacted]d being seen today for ongoing prenatal care.  She is currently monitored for the following issues for this low-risk pregnancy and has Migraine with aura and without status migrainosus, not intractable and Supervision of normal pregnancy on their problem list.  ----------------------------------------------------------------------------------- Patient reports fatigue and admtis to chewing toilet paper (PICA). she has not been taking her advised iron tablets due to constipation. Missed her last appt, so doing her 28 week labs today..   Contractions: Not present. Vag. Bleeding: None.  Movement: Present. Leaking Fluid denies.  ----------------------------------------------------------------------------------- The following portions of the patient's history were reviewed and updated as appropriate: allergies, current medications, past family history, past medical history, past social history, past surgical history and problem list. Problem list updated.  Objective  Blood pressure 126/80, weight 189 lb (85.7 kg), last menstrual period 09/21/2021. Pregravid weight 163 lb (73.9 kg) Total Weight Gain 26 lb (11.8 kg) Urinalysis: Urine Protein    Urine Glucose    Fetal Status:     Movement: Present     General:  Alert, oriented and cooperative. Patient is in no acute distress.  Skin: Skin is warm and dry. No rash noted.   Cardiovascular: Normal heart rate noted  Respiratory: Normal respiratory effort, no problems with respiration noted  Abdomen: Soft, gravid, appropriate for gestational age. Pain/Pressure: Absent     Pelvic:  Cervical exam deferred        Extremities: Normal range of motion.     Mental Status: Normal mood and affect. Normal behavior. Normal judgment and thought content.   Assessment   26 y.o. G2P1001 at [redacted]w[redacted]d by  06/28/2022, by Last Menstrual Period presenting for routine prenatal visit  Plan    [email or web address] Problems (from 10/25/21 to present)    Problem Noted Resolved   Supervision of normal pregnancy 11/17/2021 by Tresea Mall, CNM No   Overview Addendum 04/24/2022  5:14 PM by Mirna Mires, CNM     Nursing Staff Provider  Office Location  [facility] Dating  By LMP c/w 10w  Language  English Anatomy US  Normal female  Flu Vaccine   Genetic Screen  NIPS:   TDaP vaccine    Hgb A1C or  GTT Early : NA Third trimester :   Covid    LAB RESULTS   Rhogam   Blood Type A/Positive/-- (04/14 1130)   Feeding Plan Breast Antibody Negative (04/14 1130)  Contraception  Rubella 3.65 (04/14 1130)  Circumcision  RPR Non Reactive (04/14 1130)   Pediatrician   HBsAg Negative (04/14 1130)   Support Person Dajuan HIV Non Reactive (04/14 1130)  Prenatal Classes  Varicella     GBS  (For PCN allergy, check sensitivities)   BTL Consent     VBAC Consent NA Pap  2021 negative    Hgb Electro    Pelvis Tested 6#13oz CF      SMA                   Preterm labor symptoms and general obstetric precautions including but not limited to vaginal bleeding, contractions, leaking of fluid and fetal movement were reviewed in detail with the patient. Please refer to After Visit Summary for other counseling recommendations.  Discussed anemia in pregnancy with her- stressed import of eating red meat, taking her iron tabls. Will check her CBC from today's draw. No follow-ups on file.  Mirna Mires, CNM  04/27/2022 1:45 PM

## 2022-04-27 NOTE — Addendum Note (Signed)
Addended by: Liliane Shi on: 04/27/2022 02:46 PM   Modules accepted: Orders

## 2022-04-27 NOTE — Progress Notes (Signed)
Pt wants to be checked for UTI.  

## 2022-04-28 LAB — 28 WEEK RH+PANEL
Basophils Absolute: 0.1 10*3/uL (ref 0.0–0.2)
Basos: 0 %
EOS (ABSOLUTE): 0.1 10*3/uL (ref 0.0–0.4)
Eos: 1 %
Gestational Diabetes Screen: 104 mg/dL (ref 70–139)
HIV Screen 4th Generation wRfx: NONREACTIVE
Hematocrit: 24.6 % — ABNORMAL LOW (ref 34.0–46.6)
Hemoglobin: 7.3 g/dL — ABNORMAL LOW (ref 11.1–15.9)
Immature Grans (Abs): 0.5 10*3/uL — ABNORMAL HIGH (ref 0.0–0.1)
Immature Granulocytes: 3 %
Lymphocytes Absolute: 2.2 10*3/uL (ref 0.7–3.1)
Lymphs: 12 %
MCH: 23.5 pg — ABNORMAL LOW (ref 26.6–33.0)
MCHC: 29.7 g/dL — ABNORMAL LOW (ref 31.5–35.7)
MCV: 79 fL (ref 79–97)
Monocytes Absolute: 1.5 10*3/uL — ABNORMAL HIGH (ref 0.1–0.9)
Monocytes: 9 %
Neutrophils Absolute: 13.2 10*3/uL — ABNORMAL HIGH (ref 1.4–7.0)
Neutrophils: 75 %
Platelets: 336 10*3/uL (ref 150–450)
RBC: 3.11 x10E6/uL — ABNORMAL LOW (ref 3.77–5.28)
RDW: 14.4 % (ref 11.7–15.4)
RPR Ser Ql: NONREACTIVE
WBC: 17.5 10*3/uL — ABNORMAL HIGH (ref 3.4–10.8)

## 2022-04-30 ENCOUNTER — Other Ambulatory Visit: Payer: Self-pay | Admitting: Obstetrics

## 2022-04-30 ENCOUNTER — Encounter: Payer: Self-pay | Admitting: Obstetrics

## 2022-04-30 DIAGNOSIS — O99019 Anemia complicating pregnancy, unspecified trimester: Secondary | ICD-10-CM | POA: Insufficient documentation

## 2022-04-30 DIAGNOSIS — O99013 Anemia complicating pregnancy, third trimester: Secondary | ICD-10-CM

## 2022-04-30 DIAGNOSIS — Z3482 Encounter for supervision of other normal pregnancy, second trimester: Secondary | ICD-10-CM

## 2022-04-30 NOTE — Progress Notes (Signed)
Reviewed her 28 week labs. Her H and H are 7.3 and 24 respectively. I have put in a request for Heme Onc referral for iron infusions.  Mirna Mires, CNM  04/30/2022 4:05 PM

## 2022-05-01 ENCOUNTER — Other Ambulatory Visit: Payer: Self-pay

## 2022-05-01 MED ORDER — FERROUS FUMARATE 325 (106 FE) MG PO TABS: 1.0000 | ORAL_TABLET | Freq: Every day | ORAL | 6 refills | Status: AC

## 2022-05-01 NOTE — Telephone Encounter (Signed)
Pt calling; needs rx for iron pills sent in a couple of weeks ago; pharm doesn't see rx was sent. [phone] Pt aware rx has been resent.

## 2022-05-02 ENCOUNTER — Telehealth: Payer: Self-pay

## 2022-05-02 ENCOUNTER — Inpatient Hospital Stay (HOSPITAL_COMMUNITY)
Admission: AD | Admit: 2022-05-02 | Discharge: 2022-05-02 | Disposition: A | Discharge status: Home / Self Care | Payer: Medicaid Other | Attending: Obstetrics & Gynecology | Admitting: Obstetrics & Gynecology

## 2022-05-02 ENCOUNTER — Encounter (HOSPITAL_COMMUNITY): Payer: Self-pay | Admitting: Obstetrics & Gynecology

## 2022-05-02 DIAGNOSIS — O26893 Other specified pregnancy related conditions, third trimester: Secondary | ICD-10-CM | POA: Diagnosis not present

## 2022-05-02 DIAGNOSIS — O99019 Anemia complicating pregnancy, unspecified trimester: Secondary | ICD-10-CM

## 2022-05-02 DIAGNOSIS — Z20822 Contact with and (suspected) exposure to covid-19: Secondary | ICD-10-CM | POA: Insufficient documentation

## 2022-05-02 DIAGNOSIS — Z3A31 31 weeks gestation of pregnancy: Secondary | ICD-10-CM

## 2022-05-02 DIAGNOSIS — R000 Tachycardia, unspecified: Secondary | ICD-10-CM | POA: Insufficient documentation

## 2022-05-02 DIAGNOSIS — O99013 Anemia complicating pregnancy, third trimester: Secondary | ICD-10-CM

## 2022-05-02 LAB — URINALYSIS, ROUTINE W REFLEX MICROSCOPIC
Bilirubin Urine: NEGATIVE
Glucose, UA: NEGATIVE mg/dL
Hgb urine dipstick: NEGATIVE
Ketones, ur: NEGATIVE mg/dL
Nitrite: NEGATIVE
Protein, ur: 30 mg/dL — AB
Specific Gravity, Urine: 1.025 (ref 1.005–1.030)
pH: 6 (ref 5.0–8.0)

## 2022-05-02 LAB — COMPREHENSIVE METABOLIC PANEL
ALT: 12 U/L (ref 0–44)
AST: 20 U/L (ref 15–41)
Albumin: 3 g/dL — ABNORMAL LOW (ref 3.5–5.0)
Alkaline Phosphatase: 144 U/L — ABNORMAL HIGH (ref 38–126)
Anion gap: 10 (ref 5–15)
BUN: 5 mg/dL — ABNORMAL LOW (ref 6–20)
CO2: 20 mmol/L — ABNORMAL LOW (ref 22–32)
Calcium: 8.7 mg/dL — ABNORMAL LOW (ref 8.9–10.3)
Chloride: 107 mmol/L (ref 98–111)
Creatinine, Ser: 0.52 mg/dL (ref 0.44–1.00)
GFR, Estimated: >60 mL/min (ref >60)
Glucose, Bld: 75 mg/dL (ref 70–99)
Potassium: 3.5 mmol/L (ref 3.5–5.1)
Sodium: 137 mmol/L (ref 135–145)
Total Bilirubin: 1 mg/dL (ref 0.3–1.2)
Total Protein: 6.6 g/dL (ref 6.5–8.1)

## 2022-05-02 LAB — RESP PANEL BY RT-PCR (FLU A&B, COVID) ARPGX2
Influenza A by PCR: NEGATIVE
Influenza B by PCR: NEGATIVE
SARS Coronavirus 2 by RT PCR: NEGATIVE

## 2022-05-02 LAB — CBC
HCT: 24.9 % — ABNORMAL LOW (ref 36.0–46.0)
Hemoglobin: 7.6 g/dL — ABNORMAL LOW (ref 12.0–15.0)
MCH: 24.1 pg — ABNORMAL LOW (ref 26.0–34.0)
MCHC: 30.5 g/dL (ref 30.0–36.0)
MCV: 79 fL — ABNORMAL LOW (ref 80.0–100.0)
Platelets: 365 10*3/uL (ref 150–400)
RBC: 3.15 MIL/uL — ABNORMAL LOW (ref 3.87–5.11)
RDW: 15.5 % (ref 11.5–15.5)
WBC: 20.8 10*3/uL — ABNORMAL HIGH (ref 4.0–10.5)
nRBC: 0.8 % — ABNORMAL HIGH (ref 0.0–0.2)

## 2022-05-02 LAB — WET PREP, GENITAL
Clue Cells Wet Prep HPF POC: NONE SEEN
Sperm: NONE SEEN
Trich, Wet Prep: NONE SEEN
WBC, Wet Prep HPF POC: <10 (ref <10)
Yeast Wet Prep HPF POC: NONE SEEN

## 2022-05-02 MED ORDER — ALBUTEROL SULFATE (2.5 MG/3ML) 0.083% IN NEBU: 2.5000 mg | INHALATION_SOLUTION | Freq: Once | RESPIRATORY_TRACT | Status: DC | PRN

## 2022-05-02 MED ORDER — SODIUM CHLORIDE 0.9 % IV SOLN
510.0000 mg | Freq: Once | INTRAVENOUS | Status: AC
  Administered 2022-05-02: 510 mg via INTRAVENOUS
  Filled 2022-05-02: qty 510

## 2022-05-02 MED ORDER — SODIUM CHLORIDE 0.9 % IV BOLUS
1000.0000 mL | Freq: Once | INTRAVENOUS | Status: AC
  Administered 2022-05-02: 1000 mL via INTRAVENOUS

## 2022-05-02 MED ORDER — SODIUM CHLORIDE 0.9 % IV BOLUS: 500.0000 mL | Freq: Once | INTRAVENOUS | Status: DC | PRN

## 2022-05-02 MED ORDER — DIPHENHYDRAMINE HCL 50 MG/ML IJ SOLN: 25.0000 mg | Freq: Once | INTRAMUSCULAR | Status: DC | PRN

## 2022-05-02 MED ORDER — SODIUM CHLORIDE 0.9 % IV SOLN: INTRAVENOUS | Status: DC | PRN

## 2022-05-02 MED ORDER — SODIUM CHLORIDE 0.9 % IV SOLN
INTRAVENOUS | Status: DC
Start: 2022-05-02 — End: 2022-05-02

## 2022-05-02 MED ORDER — EPINEPHRINE PF 1 MG/ML IJ SOLN: 0.3000 mg | Freq: Once | INTRAMUSCULAR | Status: DC | PRN

## 2022-05-02 MED ORDER — METHYLPREDNISOLONE SODIUM SUCC 125 MG IJ SOLR: 125.0000 mg | Freq: Once | INTRAMUSCULAR | Status: DC | PRN

## 2022-05-02 NOTE — Telephone Encounter (Deleted)
Referral message updated to notify Oncology patient has reported to ED. Requested follow up for future appointment with Oncology for infusion. 

## 2022-05-02 NOTE — Telephone Encounter (Signed)
Pt calling about the iron transfusion, reports heart racing and feeling dizzy on and off. Pt aware [facility] is calling about the appointment now but if we can not get it for soon today then she should go to the er . Pt advised to sit down and rest until we get the call back from [facility].

## 2022-05-02 NOTE — MAU Provider Note (Signed)
History     [id number]  Arrival date and time: 05/02/22 1123    Chief Complaint  Patient presents with   Tachycardia   Vaginal Discharge     HPI Rebecca Duran is a 26 y.o. at [redacted]w[redacted]d who presents for tachycardia. Recently diagnosed with anemia & started oral iron supplements. Has referral for iron infusion but hasn't been scheduled yet.  Reports episodes of racing heart since this morning. Symptoms are sporadic & last about 30 minutes at a time. Feels short of breath & dizzy when she feels her heart racing. Denies LOC, headache, fever, chest pain, or cough.  Also reports pelvic pressure with urination & foul smelling discharge. States she's unsure if she has UTI or yeast infection. No vaginal irritation. Denies vaginal bleeding. Reports good fetal movement.    OB History     Gravida  2   Para  1   Term  1   Preterm      AB      Living  1      SAB      IAB      Ectopic      Multiple  0   Live Births  1           History reviewed. No pertinent past medical history.  Past Surgical History:  Procedure Laterality Date   NO PAST SURGERIES      Family History  Problem Relation Age of Onset   Healthy Mother    Healthy Father    Cancer Neg Hx    Diabetes Neg Hx    Hypertension Neg Hx     No Known Allergies  No current facility-administered medications on file prior to encounter.   Current Outpatient Medications on File Prior to Encounter  Medication Sig Dispense Refill   ferrous fumarate (HEMOCYTE - 106 MG FE) 325 (106 Fe) MG TABS tablet Take 1 tablet (106 mg of iron total) by mouth daily. 30 tablet 6   Prenatal Vit-Fe Fumarate-FA (PRENATAL MULTIVITAMIN) TABS tablet Take 1 tablet by mouth daily at 12 noon.       ROS Pertinent positives and negative per HPI, all others reviewed and negative  Physical Exam   BP 119/69   Pulse (!) 112   Temp 98.4 F (36.9 C) (Oral)   Resp 16   Ht 5' 4.5" (1.638 m)   Wt 85.4 kg   LMP 09/21/2021 (Exact Date)    SpO2 100%   BMI 31.82 kg/m   Patient Vitals for the past 24 hrs:  BP Temp Temp src Pulse Resp SpO2 Height Weight  05/02/22 1615 119/69 -- -- (!) 112 -- 100 % -- --  05/02/22 1600 (!) 116/55 -- -- (!) 105 -- 100 % -- --  05/02/22 1545 (!) 115/56 98.4 F (36.9 C) Oral (!) 116 16 100 % -- --  05/02/22 1530 (!) 124/56 -- -- (!) 115 -- 100 % -- --  05/02/22 1515 117/64 -- -- (!) 111 -- 100 % -- --  05/02/22 1500 112/63 -- -- (!) 115 -- 100 % -- --  05/02/22 1439 116/61 98.5 F (36.9 C) Oral (!) 107 18 100 % -- --  05/02/22 1325 -- 98.1 F (36.7 C) Oral -- -- -- -- --  05/02/22 1226 -- 98.2 F (36.8 C) Oral (!) 115 -- -- -- --  05/02/22 1151 116/65 -- -- (!) 115 -- -- -- --  05/02/22 1144 130/74 98.3 F (36.8 C) Oral (!) 114 18 100 %  5' 4.5" (1.638 m) 85.4 kg    Physical Exam Vitals and nursing note reviewed.  Constitutional:      General: She is not in acute distress.    Appearance: Normal appearance.  Cardiovascular:     Rate and Rhythm: Regular rhythm. Tachycardia present.     Pulses: Normal pulses.  Pulmonary:     Effort: Pulmonary effort is normal. No respiratory distress.     Breath sounds: Normal breath sounds.  Abdominal:     Tenderness: There is no right CVA tenderness or left CVA tenderness.  Skin:    General: Skin is warm and dry.  Neurological:     Mental Status: She is alert.  Psychiatric:        Mood and Affect: Mood normal.        Behavior: Behavior normal.      FHT Baseline 145, moderate variability, 15x15 accels, no decels Toco: x1 Cat: 1  Labs Results for orders placed or performed during the hospital encounter of 05/02/22 (from the past 24 hour(s))  Urinalysis, Routine w reflex microscopic     Status: Abnormal   Collection Time: 05/02/22 11:53 AM  Result Value Ref Range   Color, Urine AMBER (A) YELLOW   APPearance HAZY (A) CLEAR   Specific Gravity, Urine 1.025 1.005 - 1.030   pH 6.0 5.0 - 8.0   Glucose, UA NEGATIVE NEGATIVE mg/dL   Hgb  urine dipstick NEGATIVE NEGATIVE   Bilirubin Urine NEGATIVE NEGATIVE   Ketones, ur NEGATIVE NEGATIVE mg/dL   Protein, ur 30 (A) NEGATIVE mg/dL   Nitrite NEGATIVE NEGATIVE   Leukocytes,Ua TRACE (A) NEGATIVE   RBC / HPF 0-5 0 - 5 RBC/hpf   WBC, UA 0-5 0 - 5 WBC/hpf   Bacteria, UA MANY (A) NONE SEEN   Squamous Epithelial / LPF 6-10 0 - 5   Mucus PRESENT   Wet prep, genital     Status: None   Collection Time: 05/02/22 12:08 PM  Result Value Ref Range   Yeast Wet Prep HPF POC NONE SEEN NONE SEEN   Trich, Wet Prep NONE SEEN NONE SEEN   Clue Cells Wet Prep HPF POC NONE SEEN NONE SEEN   WBC, Wet Prep HPF POC <10 <10   Sperm NONE SEEN   CBC     Status: Abnormal   Collection Time: 05/02/22 12:20 PM  Result Value Ref Range   WBC 20.8 (H) 4.0 - 10.5 K/uL   RBC 3.15 (L) 3.87 - 5.11 MIL/uL   Hemoglobin 7.6 (L) 12.0 - 15.0 g/dL   HCT 29.5 (L) 18.8 - 41.6 %   MCV 79.0 (L) 80.0 - 100.0 fL   MCH 24.1 (L) 26.0 - 34.0 pg   MCHC 30.5 30.0 - 36.0 g/dL   RDW 60.6 30.1 - 60.1 %   Platelets 365 150 - 400 K/uL   nRBC 0.8 (H) 0.0 - 0.2 %  Comprehensive metabolic panel     Status: Abnormal   Collection Time: 05/02/22 12:20 PM  Result Value Ref Range   Sodium 137 135 - 145 mmol/L   Potassium 3.5 3.5 - 5.1 mmol/L   Chloride 107 98 - 111 mmol/L   CO2 20 (L) 22 - 32 mmol/L   Glucose, Bld 75 70 - 99 mg/dL   BUN 5 (L) 6 - 20 mg/dL   Creatinine, Ser 0.93 0.44 - 1.00 mg/dL   Calcium 8.7 (L) 8.9 - 10.3 mg/dL   Total Protein 6.6 6.5 - 8.1 g/dL   Albumin  3.0 (L) 3.5 - 5.0 g/dL   AST 20 15 - 41 U/L   ALT 12 0 - 44 U/L   Alkaline Phosphatase 144 (H) 38 - 126 U/L   Total Bilirubin 1.0 0.3 - 1.2 mg/dL   GFR, Estimated >33 >00 mL/min   Anion gap 10 5 - 15  Resp Panel by RT-PCR (Flu A&B, Covid) Anterior Nasal Swab     Status: None   Collection Time: 05/02/22  1:09 PM   Specimen: Anterior Nasal Swab  Result Value Ref Range   SARS Coronavirus 2 by RT PCR NEGATIVE NEGATIVE   Influenza A by PCR NEGATIVE  NEGATIVE   Influenza B by PCR NEGATIVE NEGATIVE    Imaging No results found.  MAU Course  Procedures Lab Orders         Wet prep, genital         Culture, OB Urine         Resp Panel by RT-PCR (Flu A&B, Covid) Anterior Nasal Swab         Urinalysis, Routine w reflex microscopic         CBC         Comprehensive metabolic panel     Meds ordered this encounter  Medications   FOLLOWED BY Linked Order Group    sodium chloride 0.9 % bolus 1,000 mL    0.9 %  sodium chloride infusion   sodium chloride 0.9 % bolus 500 mL   methylPREDNISolone sodium succinate (SOLU-MEDROL) 125 mg/2 mL injection 125 mg    IV methylprednisolone will be converted to either a q12h or q24h frequency with the same total daily dose (TDD).  Ordered Dose: 1 to 125 mg TDD; convert to: TDD q24h.  Ordered Dose: 126 to 250 mg TDD; convert to: TDD div q12h.  Ordered Dose: >250 mg TDD; DAW.   EPINEPHrine (ADRENALIN) 0.3 mg   albuterol (PROVENTIL) (2.5 MG/3ML) 0.083% nebulizer solution 2.5 mg   diphenhydrAMINE (BENADRYL) injection 25 mg   0.9 %  sodium chloride infusion   ferumoxytol (FERAHEME) 510 mg in sodium chloride 0.9 % 100 mL IVPB   Imaging Orders  No imaging studies ordered today    MDM Patient presents for evaluation of tachycardia & known anemia in pregnancy. Hemoglobin today is 7.6. Given IV feraheme in MAU without reaction. Has referral to hem/onc already placed for outpatient infusion & will follow up with them.   Elevated white count. Patient remained afebrile during her visit & has no known source of infection or concern for infection. Reports some pressure with urination but otherwise no urinary complaints, no CVA tenderness, and U/a inconclusive - will send for urine culture.   EKG reviewed by Dr. Nobie Putnam. Tachycardia - otherwise normal.   Patient's management discussed with Dr. Despina Hidden - ok for discharge home after feraheme infusion.  Assessment and Plan   1. Maternal anemia in pregnancy,  antepartum   2. [redacted] weeks gestation of pregnancy    -Continue oral iron -Reviewed reasons to return to MAU -GC/CT pending -Keep f/u with OB  Judeth Horn, NP 05/02/22 5:08 PM

## 2022-05-02 NOTE — Telephone Encounter (Signed)
Referral message updated to notify Oncology patient has reported to ED. Requested follow up for future appointment with Oncology for infusion.

## 2022-05-02 NOTE — MAU Note (Signed)
.  Rebecca Duran is a 26 y.o. at [redacted]w[redacted]d here in MAU reporting: her OB at [facility] in [place] sent her over because she was concerned about her Hemoglobin of 7. Patient reports feeling symptomatic with dizziness and lightheadedness for a few weeks. She has also had constant abdominal tightness (7/10) that started three weeks ago. She also thinks that she may have a UTI and reports odorous, white/clear discharge and pressure when voiding that started last Monday, the test her OB ran have not resulted yet. Denies VB or LOF. Reports good FM.  LMP: N/A Onset of complaint: On-going Pain score: 7/10 Vitals:   05/02/22 1144  BP: 130/74  Pulse: (!) 114  Resp: 18  Temp: 98.3 F (36.8 C)  SpO2: 100%     FHT:155 Lab orders placed from triage:  UA

## 2022-05-02 NOTE — Telephone Encounter (Signed)
Referral routed to [facility] (cancer center). Left voicemail and sent in basket message to Rose Fillers (referral coordinator) inquiring if appointment available today.

## 2022-05-02 NOTE — Telephone Encounter (Signed)
Pt states she is on her way to the ER her heart is racing all over the place.

## 2022-05-03 LAB — GC/CHLAMYDIA PROBE AMP (~~LOC~~) NOT AT ARMC
Chlamydia: NEGATIVE
Comment: NEGATIVE
Comment: NORMAL
Neisseria Gonorrhea: NEGATIVE

## 2022-05-03 LAB — CULTURE, OB URINE
Culture: NO GROWTH
Special Requests: NORMAL

## 2022-05-04 ENCOUNTER — Telehealth: Payer: Self-pay

## 2022-05-04 NOTE — Telephone Encounter (Signed)
Pt calling; was in the ER Wednesday with low iron; has appt on the 22nd; having other issues.  [phone]  Pt states she is still feeling her heart race and she almost fainted yesterday; adv to return to the ER as no providers in office today; pt concerned that baby's heart rate went up to the 190s and stayed for about 15 minutes also.  Adv pt her appt in on the 21st at 1:15.  Pt states she will go back to the ED.

## 2022-05-10 ENCOUNTER — Ambulatory Visit (INDEPENDENT_AMBULATORY_CARE_PROVIDER_SITE_OTHER): Payer: Medicaid Other | Admitting: Obstetrics & Gynecology

## 2022-05-10 VITALS — BP 122/50 | Wt 194.0 lb

## 2022-05-10 DIAGNOSIS — Z3A33 33 weeks gestation of pregnancy: Secondary | ICD-10-CM

## 2022-05-10 NOTE — Progress Notes (Signed)
Subjective:    Rebecca Duran is a 26 y.o. female being seen today for her obstetrical visit. She is at [redacted]w[redacted]d gestation. Patient reports no bleeding, no contractions, no cramping, and no leaking. Fetal movement: normal. 1 week ago pt was seen in the ER and was given Hemocyte for anemia with symptoms.  Symptoms have resolved.  Consider CBC at her next visit   Menstrual History: OB History     Gravida  2   Para  1   Term  1   Preterm      AB      Living  1      SAB      IAB      Ectopic      Multiple  0   Live Births  1            Patient's last menstrual period was 09/21/2021 (exact date).    The following portions of the patient's history were reviewed and updated as appropriate: allergies, current medications, past family history, past medical history, past social history, past surgical history, and problem list.  Review of Systems A comprehensive review of systems was negative.   Objective:    BP (!) 122/50   Wt 194 lb (88 kg)   LMP 09/21/2021 (Exact Date)   BMI 32.79 kg/m  FHT:  159 BPM  Uterine Size: 34 cm  Presentation: cephalic     Assessment:    Pregnancy 34 and 0/7 weeks   Plan:    28-week labs reviewed, low hgb was treated in the ER, f/u hgb in next 2 weeks PTL precautions Follow up in 2 Weeks.    Rosario Adie, MD  05/10/2022 2:19 PM

## 2022-05-24 ENCOUNTER — Telehealth: Payer: Self-pay

## 2022-05-24 NOTE — Telephone Encounter (Signed)
Pt left msg on triage line asking if someone can take care of fax so she can receive her free breast pump. Called pt back and advised I would check with the CMA's about this and make sure it has been taken care of.

## 2022-05-25 ENCOUNTER — Encounter: Payer: Self-pay | Admitting: Licensed Practical Nurse

## 2022-05-25 ENCOUNTER — Ambulatory Visit (INDEPENDENT_AMBULATORY_CARE_PROVIDER_SITE_OTHER): Payer: Medicaid Other | Admitting: Licensed Practical Nurse

## 2022-05-25 VITALS — BP 125/71 | HR 110 | Wt 196.0 lb

## 2022-05-25 DIAGNOSIS — Z3480 Encounter for supervision of other normal pregnancy, unspecified trimester: Secondary | ICD-10-CM | POA: Diagnosis not present

## 2022-05-25 DIAGNOSIS — Z3A35 35 weeks gestation of pregnancy: Secondary | ICD-10-CM

## 2022-05-25 DIAGNOSIS — O99013 Anemia complicating pregnancy, third trimester: Secondary | ICD-10-CM | POA: Diagnosis not present

## 2022-05-25 DIAGNOSIS — D649 Anemia, unspecified: Secondary | ICD-10-CM

## 2022-05-25 LAB — POCT URINALYSIS DIPSTICK
Blood, UA: NEGATIVE
Glucose, UA: NEGATIVE
Leukocytes, UA: NEGATIVE
Protein, UA: NEGATIVE
Spec Grav, UA: 1.01 (ref 1.010–1.025)
Urobilinogen, UA: 0.2 E.U./dL
pH, UA: 5 (ref 5.0–8.0)

## 2022-05-25 NOTE — Progress Notes (Signed)
Routine Prenatal Care Visit  Subjective  Rebecca Duran is a 26 y.o. G2P1001 at [redacted]w[redacted]d being seen today for ongoing prenatal care.  She is currently monitored for the following issues for this low-risk pregnancy and has Migraine with aura and without status migrainosus, not intractable; Supervision of normal pregnancy; and Maternal anemia in pregnancy, antepartum on their problem list.  ----------------------------------------------------------------------------------- Patient reports  having normal third trimester complaints -reflux, sleep disturbances but managing.  -was seen at the MAU on 9/13 and was given a FE transfusion, feels better since then, no longer has abnormal cravings.  -would like to labor without an epidural  -hopes to breastfeed longer this time, feels more prepared to be committed to BF -her partner and mom will be with her in labor.  -discussed flu and TDAP, considering TDAP at next appt   Contractions: Irritability. Vag. Bleeding: None.  Movement: Present. Leaking Fluid denies.  ----------------------------------------------------------------------------------- The following portions of the patient's history were reviewed and updated as appropriate: allergies, current medications, past family history, past medical history, past social history, past surgical history and problem list. Problem list updated.  Objective  Blood pressure 125/71, pulse (!) 110, weight 196 lb (88.9 kg), last menstrual period 09/21/2021. Pregravid weight 163 lb (73.9 kg) Total Weight Gain 33 lb (15 kg) Urinalysis: Urine Protein    Urine Glucose    Fetal Status:   Fundal Height: 35 cm Movement: Present     General:  Alert, oriented and cooperative. Patient is in no acute distress.  Skin: Skin is warm and dry. No rash noted.   Cardiovascular: Normal heart rate noted  Respiratory: Normal respiratory effort, no problems with respiration noted  Abdomen: Soft, gravid, appropriate for gestational age.  Pain/Pressure: Present     Pelvic:  Cervical exam deferred        Extremities: Normal range of motion.  Edema: None  Mental Status: Normal mood and affect. Normal behavior. Normal judgment and thought content.   Assessment   26 y.o. G2P1001 at [redacted]w[redacted]d by  06/28/2022, by Last Menstrual Period presenting for routine prenatal visit  Plan   [email or web address] Problems (from 10/25/21 to present)     Problem Noted Resolved   Maternal anemia in pregnancy, antepartum 04/30/2022 by Mirna Mires, CNM No   Supervision of normal pregnancy 11/17/2021 by Tresea Mall, CNM No   Overview Addendum 04/24/2022  5:14 PM by Mirna Mires, CNM     Nursing Staff Provider  Office Location  [facility] Dating  By LMP c/w 10w  Language  English Anatomy US  Normal female  Flu Vaccine   Genetic Screen  NIPS:   TDaP vaccine    Hgb A1C or  GTT Early : NA Third trimester :   Covid    LAB RESULTS   Rhogam   Blood Type A/Positive/-- (04/14 1130)   Feeding Plan Breast Antibody Negative (04/14 1130)  Contraception  Rubella 3.65 (04/14 1130)  Circumcision  RPR Non Reactive (04/14 1130)   Pediatrician   HBsAg Negative (04/14 1130)   Support Person Dajuan HIV Non Reactive (04/14 1130)  Prenatal Classes  Varicella     GBS  (For PCN allergy, check sensitivities)   BTL Consent     VBAC Consent NA Pap  2021 negative    Hgb Electro    Pelvis Tested 6#13oz CF      SMA                    Preterm labor symptoms and  general obstetric precautions including but not limited to vaginal bleeding, contractions, leaking of fluid and fetal movement were reviewed in detail with the patient. Please refer to After Visit Summary for other counseling recommendations.   Return in about 2 weeks (around 06/08/2022) for ROB, 1 or 2 weeks, 36 wklabs .   CBC collected today   Offer TDAP next apt   Roberto Scales, [place], [facility]  05/25/22  2:25 PM

## 2022-05-26 ENCOUNTER — Encounter: Payer: Self-pay | Admitting: Licensed Practical Nurse

## 2022-05-26 LAB — CBC WITH DIFFERENTIAL/PLATELET
Basophils Absolute: 0.1 10*3/uL (ref 0.0–0.2)
Basos: 1 %
EOS (ABSOLUTE): 0.1 10*3/uL (ref 0.0–0.4)
Eos: 1 %
Hematocrit: 27.9 % — ABNORMAL LOW (ref 34.0–46.6)
Hemoglobin: 8.2 g/dL — ABNORMAL LOW (ref 11.1–15.9)
Immature Grans (Abs): 0.3 10*3/uL — ABNORMAL HIGH (ref 0.0–0.1)
Immature Granulocytes: 2 %
Lymphocytes Absolute: 2.2 10*3/uL (ref 0.7–3.1)
Lymphs: 15 %
MCH: 23.5 pg — ABNORMAL LOW (ref 26.6–33.0)
MCHC: 29.4 g/dL — ABNORMAL LOW (ref 31.5–35.7)
MCV: 80 fL (ref 79–97)
Monocytes Absolute: 1.7 10*3/uL — ABNORMAL HIGH (ref 0.1–0.9)
Monocytes: 12 %
Neutrophils Absolute: 10.2 10*3/uL — ABNORMAL HIGH (ref 1.4–7.0)
Neutrophils: 69 %
Platelets: 372 10*3/uL (ref 150–450)
RBC: 3.49 x10E6/uL — ABNORMAL LOW (ref 3.77–5.28)
RDW: 18.9 % — ABNORMAL HIGH (ref 11.7–15.4)
WBC: 14.6 10*3/uL — ABNORMAL HIGH (ref 3.4–10.8)

## 2022-05-28 ENCOUNTER — Encounter: Payer: Self-pay | Admitting: Obstetrics

## 2022-05-29 NOTE — Telephone Encounter (Signed)
Signed and faxed yesterday

## 2022-05-31 ENCOUNTER — Other Ambulatory Visit (HOSPITAL_COMMUNITY)
Admission: RE | Admit: 2022-05-31 | Discharge: 2022-05-31 | Disposition: A | Discharge status: Home / Self Care | Payer: Medicaid Other | Source: Ambulatory Visit | Attending: Obstetrics and Gynecology | Admitting: Obstetrics and Gynecology

## 2022-05-31 ENCOUNTER — Inpatient Hospital Stay: Payer: Medicaid Other | Attending: Oncology | Admitting: Oncology

## 2022-05-31 ENCOUNTER — Ambulatory Visit (INDEPENDENT_AMBULATORY_CARE_PROVIDER_SITE_OTHER): Payer: Medicaid Other | Admitting: Obstetrics and Gynecology

## 2022-05-31 ENCOUNTER — Telehealth: Payer: Self-pay

## 2022-05-31 ENCOUNTER — Inpatient Hospital Stay: Payer: Medicaid Other

## 2022-05-31 ENCOUNTER — Encounter: Payer: Self-pay | Admitting: Oncology

## 2022-05-31 ENCOUNTER — Encounter: Payer: Self-pay | Admitting: Obstetrics and Gynecology

## 2022-05-31 VITALS — BP 110/72 | HR 121 | Wt 196.7 lb

## 2022-05-31 VITALS — BP 117/66 | HR 119 | Temp 96.7°F | Resp 18 | Wt 195.0 lb

## 2022-05-31 DIAGNOSIS — D508 Other iron deficiency anemias: Secondary | ICD-10-CM

## 2022-05-31 DIAGNOSIS — Z23 Encounter for immunization: Secondary | ICD-10-CM | POA: Diagnosis not present

## 2022-05-31 DIAGNOSIS — Z3480 Encounter for supervision of other normal pregnancy, unspecified trimester: Secondary | ICD-10-CM | POA: Diagnosis not present

## 2022-05-31 DIAGNOSIS — D649 Anemia, unspecified: Secondary | ICD-10-CM | POA: Insufficient documentation

## 2022-05-31 DIAGNOSIS — D509 Iron deficiency anemia, unspecified: Secondary | ICD-10-CM | POA: Insufficient documentation

## 2022-05-31 DIAGNOSIS — Z113 Encounter for screening for infections with a predominantly sexual mode of transmission: Secondary | ICD-10-CM | POA: Diagnosis present

## 2022-05-31 DIAGNOSIS — Z3685 Encounter for antenatal screening for Streptococcus B: Secondary | ICD-10-CM

## 2022-05-31 DIAGNOSIS — Z3A36 36 weeks gestation of pregnancy: Secondary | ICD-10-CM | POA: Insufficient documentation

## 2022-05-31 DIAGNOSIS — Z3483 Encounter for supervision of other normal pregnancy, third trimester: Secondary | ICD-10-CM

## 2022-05-31 LAB — CBC WITH DIFFERENTIAL/PLATELET
Abs Immature Granulocytes: 0.6 10*3/uL — ABNORMAL HIGH (ref 0.00–0.07)
Basophils Absolute: 0.1 10*3/uL (ref 0.0–0.1)
Basophils Relative: 1 %
Eosinophils Absolute: 0.1 10*3/uL (ref 0.0–0.5)
Eosinophils Relative: 1 %
HCT: 29 % — ABNORMAL LOW (ref 36.0–46.0)
Hemoglobin: 8.7 g/dL — ABNORMAL LOW (ref 12.0–15.0)
Immature Granulocytes: 3 %
Lymphocytes Relative: 12 %
Lymphs Abs: 2.1 10*3/uL (ref 0.7–4.0)
MCH: 23.5 pg — ABNORMAL LOW (ref 26.0–34.0)
MCHC: 30 g/dL (ref 30.0–36.0)
MCV: 78.2 fL — ABNORMAL LOW (ref 80.0–100.0)
Monocytes Absolute: 1.7 10*3/uL — ABNORMAL HIGH (ref 0.1–1.0)
Monocytes Relative: 10 %
Neutro Abs: 12.9 10*3/uL — ABNORMAL HIGH (ref 1.7–7.7)
Neutrophils Relative %: 73 %
Platelets: 331 10*3/uL (ref 150–400)
RBC: 3.71 MIL/uL — ABNORMAL LOW (ref 3.87–5.11)
RDW: 19.9 % — ABNORMAL HIGH (ref 11.5–15.5)
WBC: 17.4 10*3/uL — ABNORMAL HIGH (ref 4.0–10.5)
nRBC: 0.1 % (ref 0.0–0.2)

## 2022-05-31 LAB — TECHNOLOGIST SMEAR REVIEW
Plt Morphology: NORMAL
RBC MORPHOLOGY: NORMAL
WBC MORPHOLOGY: NORMAL

## 2022-05-31 LAB — RETIC PANEL
Immature Retic Fract: 35.8 % — ABNORMAL HIGH (ref 2.3–15.9)
RBC.: 3.64 MIL/uL — ABNORMAL LOW (ref 3.87–5.11)
Retic Count, Absolute: 65.2 10*3/uL (ref 19.0–186.0)
Retic Ct Pct: 1.8 % (ref 0.4–3.1)
Reticulocyte Hemoglobin: 21.7 pg — ABNORMAL LOW (ref >27.9)

## 2022-05-31 LAB — POCT URINALYSIS DIPSTICK OB
Bilirubin, UA: NEGATIVE
Blood, UA: NEGATIVE
Glucose, UA: NEGATIVE
Ketones, UA: NEGATIVE
Leukocytes, UA: NEGATIVE
Nitrite, UA: NEGATIVE
Spec Grav, UA: 1.015 (ref 1.010–1.025)
Urobilinogen, UA: 0.2 E.U./dL
pH, UA: 6.5 (ref 5.0–8.0)

## 2022-05-31 LAB — FERRITIN: Ferritin: 10 ng/mL — ABNORMAL LOW (ref 11–307)

## 2022-05-31 LAB — IRON AND TIBC
Iron: 31 ug/dL (ref 28–170)
Saturation Ratios: 5 % — ABNORMAL LOW (ref 10.4–31.8)
TIBC: 599 ug/dL — ABNORMAL HIGH (ref 250–450)
UIBC: 568 ug/dL

## 2022-05-31 NOTE — Progress Notes (Signed)
ROB. Patient states daily fetal movement and pressure. She is following up with hematology today due to iron. TDAP administered. GC/CT and GBS cultures ordered, culture preformed by patient. Patient states no questions or concerns at this time.

## 2022-05-31 NOTE — Progress Notes (Signed)
ROB: Patient without complaint.  Has occasional irregular contractions.  Reports daily fetal movement.  She is being seen for iron infusion by hematology later today.  Signs and symptoms of labor discussed.  GC/CT-GBS performed today.

## 2022-05-31 NOTE — Telephone Encounter (Signed)
-----   Message from Earlie Server, MD sent at 05/31/2022 12:33 PM EDT ----- Iron is low. Please arrange patient to have IV Venofer weekly x 5 Follow up plan 4 months, labs prior to MD + Venofer.  Thanks.

## 2022-05-31 NOTE — Progress Notes (Signed)
Patient here for oncology follow-up appointment, concerns of SOB, elevated HR and slight  palpitations

## 2022-05-31 NOTE — Progress Notes (Signed)
Hematology/Oncology Consult note [phone] [phone]      Patient Care Team: [name], MD as PCP - General (Internal Medicine)   REFERRING PROVIDER: Imagene Riches, CNM  CHIEF COMPLAINTS/REASON FOR VISIT:  Anemia  ASSESSMENT & PLAN:   IDA (iron deficiency anemia) Check cbc, smear, iron tibc ferritin.  Discussed about the option of IV venofer.  I discussed about the potential risks including but not limited to allergic reactions/infusion reactions including anaphylactic reactions, phlebitis, high blood pressure, wheezing, SOB, skin rash, weight gain, leg swelling, headache, nausea and fatigue, etc. Venofer is assigned to FDA pregnancy category B based on safety studies in pregnancy. Patient agrees with IV venofer Plan IV venofer weekly x 5    Orders Placed This Encounter  Procedures   CBC with Differential/Platelet    Standing Status:   Future    Number of Occurrences:   1    Standing Expiration Date:   06/01/2023   Ferritin    Standing Status:   Future    Number of Occurrences:   1    Standing Expiration Date:   11/30/2022   Iron and TIBC    Standing Status:   Future    Number of Occurrences:   1    Standing Expiration Date:   06/01/2023   Retic Panel    Standing Status:   Future    Number of Occurrences:   1    Standing Expiration Date:   06/01/2023   Technologist smear review    Standing Status:   Future    Number of Occurrences:   1    Standing Expiration Date:   06/01/2023    Order Specific Question:   Clinical information:    Answer:   anemia   Follow-up 4 months assessment of need of additional IV Venofer treatments. All questions were answered. The patient knows to call the clinic with any problems, questions or concerns.  Earlie Server, MD, PhD [facility] Hematology Oncology 05/31/2022     HISTORY OF PRESENTING ILLNESS:  Rebecca Duran is a  26 y.o.  female with PMH listed below who was referred to me for  anemia Reviewed patient's recent labs that was done.  She was found to have abnormal CBC on 05/02/2022 with a hemoglobin of 7.6, MCV 79.  Patient was emergency room and received 1 dose of IV Feraheme treatment.  She tolerated well.  Reviewed patient's previous labs ordered by primary care physician's office, patient has normal hemoglobin in April 2023.  Patient has a history of heavy menstrual bleeding.  Currently she is in third trimester pregnancy, her due date is in November. + Fatigue, shortness of breath, palpitation. She denies recent chest pain on exertion, pre-syncopal episodes She had not noticed any recent bleeding such as epistaxis, hematuria or hematochezia.  She denies over the counter NSAID ingestion.     MEDICAL HISTORY:  Past Medical History:  Diagnosis Date   IDA (iron deficiency anemia)     SURGICAL HISTORY: Past Surgical History:  Procedure Laterality Date   NO PAST SURGERIES      SOCIAL HISTORY: Social History   Socioeconomic History   Marital status: Single    Spouse name: Not on file   Number of children: Not on file   Years of education: Not on file   Highest education level: Not on file  Occupational History   Not on file  Tobacco Use   Smoking status: Never   Smokeless tobacco: Never  Vaping Use  Vaping Use: Never used  Substance and Sexual Activity   Alcohol use: Not Currently    Comment: occasionally   Drug use: No   Sexual activity: Yes    Birth control/protection: None  Other Topics Concern   Not on file  Social History Narrative   Not on file   Social Determinants of Health   Financial Resource Strain: Not on file  Food Insecurity: Not on file  Transportation Needs: Not on file  Physical Activity: Not on file  Stress: Not on file  Social Connections: Not on file  Intimate Partner Violence: Not on file    FAMILY HISTORY: Family History  Problem Relation Age of Onset   Healthy Mother    Cancer - Other Mother    Healthy  Father    Cancer Neg Hx    Diabetes Neg Hx    Hypertension Neg Hx     ALLERGIES:  has No Known Allergies.  MEDICATIONS:  Current Outpatient Medications  Medication Sig Dispense Refill   ferrous fumarate (HEMOCYTE - 106 MG FE) 325 (106 Fe) MG TABS tablet Take 1 tablet (106 mg of iron total) by mouth daily. 30 tablet 6   Prenatal Vit-Fe Fumarate-FA (PRENATAL MULTIVITAMIN) TABS tablet Take 1 tablet by mouth daily at 12 noon.     No current facility-administered medications for this visit.    Review of Systems  Constitutional:  Positive for fatigue. Negative for appetite change, chills and fever.  HENT:   Negative for hearing loss and voice change.   Eyes:  Negative for eye problems.  Respiratory:  Positive for shortness of breath. Negative for chest tightness and cough.   Cardiovascular:  Positive for palpitations. Negative for chest pain.  Gastrointestinal:  Negative for abdominal distention, abdominal pain and blood in stool.  Endocrine: Negative for hot flashes.  Genitourinary:  Negative for difficulty urinating and frequency.   Musculoskeletal:  Negative for arthralgias.  Skin:  Negative for itching and rash.  Neurological:  Negative for extremity weakness.  Hematological:  Negative for adenopathy.  Psychiatric/Behavioral:  Negative for confusion.     PHYSICAL EXAMINATION: ECOG PERFORMANCE STATUS: 0 - Asymptomatic Vitals:   05/31/22 0942  BP: 117/66  Pulse: (!) 119  Resp: 18  Temp: (!) 96.7 F (35.9 C)  SpO2: 100%   Filed Weights   05/31/22 0942  Weight: 195 lb (88.5 kg)    Physical Exam Constitutional:      General: She is not in acute distress. HENT:     Head: Normocephalic and atraumatic.  Eyes:     General: No scleral icterus. Cardiovascular:     Rate and Rhythm: Regular rhythm. Tachycardia present.     Comments: + soft flow murmur Pulmonary:     Effort: Pulmonary effort is normal. No respiratory distress.     Breath sounds: No wheezing.  Abdominal:      Comments: Gravid Uterus  Musculoskeletal:        General: No deformity. Normal range of motion.     Cervical back: Normal range of motion and neck supple.  Skin:    General: Skin is warm and dry.     Findings: No erythema or rash.  Neurological:     Mental Status: She is alert and oriented to person, place, and time. Mental status is at baseline.     Cranial Nerves: No cranial nerve deficit.     Coordination: Coordination normal.  Psychiatric:        Mood and Affect: Mood normal.  LABORATORY DATA:  I have reviewed the data as listed    Latest Ref Rng & Units 05/31/2022   10:08 AM 05/25/2022    1:40 PM 05/02/2022   12:20 PM  CBC  WBC 4.0 - 10.5 K/uL 17.4  14.6  20.8   Hemoglobin 12.0 - 15.0 g/dL 8.7  8.2  7.6   Hematocrit 36.0 - 46.0 % 29.0  27.9  24.9   Platelets 150 - 400 K/uL 331  372  365       Latest Ref Rng & Units 05/02/2022   12:20 PM 10/25/2021    6:42 PM  CMP  Glucose 70 - 99 mg/dL 75  89   BUN 6 - 20 mg/dL 5  5   Creatinine 0.44 - 1.00 mg/dL 0.52  0.67   Sodium 135 - 145 mmol/L 137  134   Potassium 3.5 - 5.1 mmol/L 3.5  3.4   Chloride 98 - 111 mmol/L 107  101   CO2 22 - 32 mmol/L 20  21   Calcium 8.9 - 10.3 mg/dL 8.7  9.2   Total Protein 6.5 - 8.1 g/dL 6.6  7.7   Total Bilirubin 0.3 - 1.2 mg/dL 1.0  1.2   Alkaline Phos 38 - 126 U/L 144  53   AST 15 - 41 U/L 20  21   ALT 0 - 44 U/L 12  19    No results found for: "IRON", "TIBC", "FERRITIN", "IRONPCTSAT"   RADIOGRAPHIC STUDIES: I have personally reviewed the radiological images as listed and agreed with the findings in the report. No results found.

## 2022-05-31 NOTE — Assessment & Plan Note (Addendum)
Check cbc, smear, iron tibc ferritin.  Discussed about the option of IV venofer.  I discussed about the potential risks including but not limited to allergic reactions/infusion reactions including anaphylactic reactions, phlebitis, high blood pressure, wheezing, SOB, skin rash, weight gain, leg swelling, headache, nausea and fatigue, etc. Venofer is assigned to FDA pregnancy category B based on safety studies in pregnancy. Patient agrees with IV venofer Plan IV venofer weekly x 5

## 2022-05-31 NOTE — Telephone Encounter (Signed)
Unable to reach pt by ph. Detailed VM left and myhchart message sent.   Please scheudule and inform pt:   IV venofer weekly x5. (First iron dose is NEW) Labs in 4 months  MD/ venofer 1-2 days AFTER labs

## 2022-05-31 NOTE — Telephone Encounter (Signed)
Pt called back, she has been scheduled and made aware of her appts.

## 2022-06-01 LAB — CERVICOVAGINAL ANCILLARY ONLY
Chlamydia: NEGATIVE
Comment: NEGATIVE
Comment: NORMAL
Neisseria Gonorrhea: NEGATIVE

## 2022-06-02 LAB — STREP GP B NAA: Strep Gp B NAA: NEGATIVE

## 2022-06-06 ENCOUNTER — Inpatient Hospital Stay: Payer: Medicaid Other

## 2022-06-06 VITALS — BP 121/68 | HR 110 | Temp 97.1°F | Resp 18

## 2022-06-06 DIAGNOSIS — D508 Other iron deficiency anemias: Secondary | ICD-10-CM

## 2022-06-06 DIAGNOSIS — D509 Iron deficiency anemia, unspecified: Secondary | ICD-10-CM | POA: Diagnosis not present

## 2022-06-06 MED ORDER — SODIUM CHLORIDE 0.9 % IV SOLN
Freq: Once | INTRAVENOUS | Status: AC
  Filled 2022-06-06: qty 250

## 2022-06-06 MED ORDER — SODIUM CHLORIDE 0.9 % IV SOLN
200.0000 mg | Freq: Once | INTRAVENOUS | Status: AC
  Administered 2022-06-06: 200 mg via INTRAVENOUS
  Filled 2022-06-06: qty 200

## 2022-06-08 ENCOUNTER — Encounter: Payer: Self-pay | Admitting: Advanced Practice Midwife

## 2022-06-08 ENCOUNTER — Ambulatory Visit (INDEPENDENT_AMBULATORY_CARE_PROVIDER_SITE_OTHER): Payer: Medicaid Other | Admitting: Advanced Practice Midwife

## 2022-06-08 VITALS — BP 112/64 | HR 117 | Wt 196.0 lb

## 2022-06-08 DIAGNOSIS — O99353 Diseases of the nervous system complicating pregnancy, third trimester: Secondary | ICD-10-CM

## 2022-06-08 DIAGNOSIS — G43109 Migraine with aura, not intractable, without status migrainosus: Secondary | ICD-10-CM

## 2022-06-08 DIAGNOSIS — O99013 Anemia complicating pregnancy, third trimester: Secondary | ICD-10-CM

## 2022-06-08 DIAGNOSIS — D509 Iron deficiency anemia, unspecified: Secondary | ICD-10-CM

## 2022-06-08 DIAGNOSIS — Z3483 Encounter for supervision of other normal pregnancy, third trimester: Secondary | ICD-10-CM

## 2022-06-08 DIAGNOSIS — Z3A37 37 weeks gestation of pregnancy: Secondary | ICD-10-CM

## 2022-06-08 LAB — POCT URINALYSIS DIPSTICK
Glucose, UA: NEGATIVE
Protein, UA: NEGATIVE

## 2022-06-08 NOTE — Progress Notes (Signed)
Routine Prenatal Care Visit  Subjective  Rebecca Duran is a 26 y.o. G2P1001 at [redacted]w[redacted]d being seen today for ongoing prenatal care.  She is currently monitored for the following issues for this low-risk pregnancy and has Migraine with aura and without status migrainosus, not intractable; Supervision of normal pregnancy; Maternal anemia in pregnancy, antepartum; Absolute anemia; and IDA (iron deficiency anemia) on their problem list.  ----------------------------------------------------------------------------------- Patient reports some symptoms of anemia; lightheaded yesterday. She is receiving weekly Fe infusions.   Contractions: Irregular. Vag. Bleeding: None.  Movement: Present. Leaking Fluid denies.  ----------------------------------------------------------------------------------- The following portions of the patient's history were reviewed and updated as appropriate: allergies, current medications, past family history, past medical history, past social history, past surgical history and problem list. Problem list updated.  Objective  Blood pressure 112/64, pulse (!) 117, weight 196 lb (88.9 kg), last menstrual period 09/21/2021. Pregravid weight 163 lb (73.9 kg) Total Weight Gain 33 lb (15 kg) Urinalysis: Urine Protein    Urine Glucose    Fetal Status: Fetal Heart Rate (bpm): 149 Fundal Height: 37 cm Movement: Present  Presentation: Vertex  General:  Alert, oriented and cooperative. Patient is in no acute distress.  Skin: Skin is warm and dry. No rash noted.   Cardiovascular: Normal heart rate noted  Respiratory: Normal respiratory effort, no problems with respiration noted  Abdomen: Soft, gravid, appropriate for gestational age. Pain/Pressure: Present     Pelvic:  Cervical exam performed Dilation: Closed     posterior  Extremities: Normal range of motion.  Edema: None  Mental Status: Normal mood and affect. Normal behavior. Normal judgment and thought content.   Assessment   26  y.o. G2P1001 at [redacted]w[redacted]d by  06/28/2022, by Last Menstrual Period presenting for routine prenatal visit  Plan   [email or web address] Problems (from 10/25/21 to present)    Problem Noted Resolved   Maternal anemia in pregnancy, antepartum 04/30/2022 by Imagene Riches, CNM No   Supervision of normal pregnancy 11/17/2021 by Rod Can, CNM No   Overview Addendum 04/24/2022  5:14 PM by Imagene Riches, CNM     Nursing Staff Provider  Office Location  [facility] Dating  By LMP c/w 10w  Language  English Anatomy US  Normal female  Flu Vaccine   Genetic Screen  NIPS:   TDaP vaccine    Hgb A1C or  GTT Early : NA Third trimester :   Covid    LAB RESULTS   Rhogam   Blood Type A/Positive/-- (04/14 1130)   Feeding Plan Breast Antibody Negative (04/14 1130)  Contraception  Rubella 3.65 (04/14 1130)  Circumcision  RPR Non Reactive (04/14 1130)   Pediatrician   HBsAg Negative (04/14 1130)   Support Person Dajuan HIV Non Reactive (04/14 1130)  Prenatal Classes  Varicella     GBS  (For PCN allergy, check sensitivities)   BTL Consent     VBAC Consent NA Pap  2021 negative    Hgb Electro    Pelvis Tested 6#13oz CF      SMA                   Term labor symptoms and general obstetric precautions including but not limited to vaginal bleeding, contractions, leaking of fluid and fetal movement were reviewed in detail with the patient. Please refer to After Visit Summary for other counseling recommendations.   Return in about 1 week (around 06/15/2022) for rob.  Rod Can, CNM 06/08/2022 10:59 AM

## 2022-06-08 NOTE — Patient Instructions (Signed)
Pain Relief During Labor and Delivery Many things can cause pain during labor and delivery, including: Pressure due to the baby moving through the pelvis. Stretching of tissues due to the baby moving through the birth canal. Muscle tension due to anxiety or nervousness. The uterus tightening (contracting)and relaxing to help move the baby. How do I get pain relief during labor and delivery?  Discuss your pain relief options with your health care provider during your prenatal visits. Explore the options offered by your hospital or birth center. There are many ways to deal with the pain of labor and delivery. You can try relaxation techniques or doing relaxing activities, taking a warm shower or bath (hydrotherapy), or other methods. There are also many medicines available to help control pain. Relaxation techniques and activities Practice relaxation techniques or do relaxing activities, such as: Focused breathing. Meditation. Visualization. Aroma therapy. Listening to your favorite music. Hypnosis. Hydrotherapy Take a warm shower or bath. This may: Provide comfort and relaxation. Lessen your feeling of pain. Reduce the amount of pain medicine needed. Shorten the length of labor. Other methods Try doing other things, such as: Getting a massage or having counterpressure on your back. Applying warm packs or ice packs. Changing positions often, moving around, or using a birthing ball. Medicines You may be given: Pain medicine through an IV or an injection into a muscle. Pain medicine inserted into your spinal column. Injections of sterile water just under the skin on your lower back. Nitrous oxide inhalation therapy, also called laughing gas. What kinds of medicine are available for pain relief? There are two kinds of medicines that can be used to relieve pain during labor and delivery: Analgesics. These medicines decrease pain without causing you to lose feeling or the ability to move  your muscles. Anesthetics. These medicines block feeling in the body and can decrease your ability to move freely. Both kinds of medicine can cause minor side effects, such as nausea, trouble concentrating, and sleepiness. They can also affect the baby's heart rate before birth and his or her breathing after birth. For this reason, health care providers are careful about when and how much medicine is given. Which medicines are used to provide pain relief? Common medicines The most common medicines used to help manage pain during labor and delivery include: Opioids. Opioids are medicines that decrease how much pain you feel (perception of pain). These medicines can be given through an IV or may be used with anesthetics to block pain. Epidural analgesia. Epidural analgesia is given through a very thin tube that is inserted into the lower back. Medicine is delivered continuously to the area near your spinal column nerves (epidural space). After having this treatment, you may be able to move your legs, but you will not be able to walk. Depending on the amount and type of medicine given, you may lose all feeling in the lower half of your body, or you may have some sensation, including the urge to push. This treatment can be used to give pain relief for a vaginal birth. Sometimes, a numbing medicine is injected into the spinal fluid when an epidural catheter is placed. This provides for immediate relief but only lasts for 1-2 hours. Once it wears off, the epidural will provide pain relief. This is called a combined spinal-epidural (CSE) block. Intrathecal analgesia (spinal analgesia). Intrathecal analgesia is similar to epidural analgesia, but the medicine is injected into the spinal fluid instead of the epidural space. It is usually only given once.   It starts to relieve pain quickly, but the pain relief lasts only 1-2 hours. Pudendal block. This block is done by injecting numbing medicine through the wall of  the vagina and into a nerve in the pelvis. Other medicines Other medicines used to help manage pain during labor and delivery include: Local anesthetics. These are used to numb a small area of the body. They may be used along with another kind of medicine or used to numb the nerves of the vagina, cervix, and perineum during the second stage of labor. Spinal block (spinal anesthesia). Spinal anesthesia is similar to spinal analgesia, but the medicine that is used contains longer-acting numbing medicines and pain medicines. This type of anesthesia can be used for a cesarean delivery and allows you to stay awake for the birth of your baby. General anesthetics cause you to lose consciousness so you do not feel pain. They are usually only used for an emergency cesarean delivery. These medicines are given through an IV or a mask or both. These medicines are used as part of a procedure or for an emergency delivery. Summary Women have many options to help them manage the pain associated with labor and delivery. You can try doing relaxing activities, taking a warm shower or bath, or other methods. There are also many medicines available to help control pain during labor and delivery. Talk with your health care provider about what options are available to you. This information is not intended to replace advice given to you by your health care provider. Make sure you discuss any questions you have with your health care provider. Document Revised: 06/24/2019 Document Reviewed: 06/24/2019 Elsevier Patient Education  2023 Elsevier Inc. Signs and Symptoms of Labor Labor is the body's natural process of moving the baby and the placenta out of the uterus. The process of labor usually starts when the baby is full-term, between 39 and 41 weeks of pregnancy. Signs and symptoms that you are close to going into labor As your body prepares for labor and the birth of your baby, you may notice the following symptoms in  the weeks and days before true labor starts: Passing a small amount of thick, bloody mucus from your vagina. This is called normal bloody show or losing your mucus plug. This may happen more than a week before labor begins, or right before labor begins, as the opening of the cervix starts to widen (dilate). For some women, the entire mucus plug passes at once. For others, pieces of the mucus plug may gradually pass over several days. Your baby moving (dropping) lower in your pelvis to get into position for birth (lightening). When this happens, you may feel more pressure on your bladder and pelvic bone and less pressure on your ribs. This may make it easier to breathe. It may also cause you to need to urinate more often and have problems with bowel movements. Having "practice contractions," also called Braxton Hicks contractions or false labor. These occur at irregular (unevenly spaced) intervals that are more than 10 minutes apart. False labor contractions are common after exercise or sexual activity. They will stop if you change position, rest, or drink fluids. These contractions are usually mild and do not get stronger over time. They may feel like: A backache or back pain. Mild cramps, similar to menstrual cramps. Tightening or pressure in your abdomen. Other early symptoms include: Nausea or loss of appetite. Diarrhea. Having a sudden burst of energy, or feeling very tired. Mood changes. Having trouble   sleeping. Signs and symptoms that labor has begun Signs that you are in labor may include: Having contractions that come at regular (evenly spaced) intervals and increase in intensity. This may feel like more intense tightening or pressure in your abdomen that moves to your back. Contractions may also feel like rhythmic pain in your upper thighs or back that comes and goes at regular intervals. If you are delivering for the first time, this change in intensity of contractions often occurs at a  more gradual pace. If you have given birth before, you may notice a more rapid progression of contraction changes. Feeling pressure in the vaginal area. Your water breaking (rupture of membranes). This is when the sac of fluid that surrounds your baby breaks. Fluid leaking from your vagina may be clear or blood-tinged. Labor usually starts within 24 hours of your water breaking, but it may take longer to begin. Some people may feel a sudden gush of fluid; others may notice repeatedly damp underwear. Follow these instructions at home:  When labor starts, or if your water breaks, call your health care provider or nurse care line. Based on your situation, they will determine when you should go in for an exam. During early labor, you may be able to rest and manage symptoms at home. Some strategies to try at home include: Breathing and relaxation techniques. Taking a warm bath or shower. Listening to music. Using a heating pad on the lower back for pain. If directed, apply heat to the area as often as told by your health care provider. Use the heat source that your health care provider recommends, such as a moist heat pack or a heating pad. Place a towel between your skin and the heat source. Leave the heat on for 20-30 minutes. Remove the heat if your skin turns bright red. This is especially important if you are unable to feel pain, heat, or cold. You have a greater risk of getting burned. Contact a health care provider if: Your labor has started. Your water breaks. You have nausea, vomiting, or diarrhea. Get help right away if: You have painful, regular contractions that are 5 minutes apart or less. Labor starts before you are [redacted] weeks along in your pregnancy. You have a fever. You have bright red blood coming from your vagina. You do not feel your baby moving. You have a severe headache with or without vision problems. You have chest pain or shortness of breath. These symptoms may  represent a serious problem that is an emergency. Do not wait to see if the symptoms will go away. Get medical help right away. Call your local emergency services (911 in the [place] Do not drive yourself to the hospital. Summary Labor is your body's natural process of moving your baby and the placenta out of your uterus. The process of labor usually starts when your baby is full-term, between 39 and 40 weeks of pregnancy. When labor starts, or if your water breaks, call your health care provider or nurse care line. Based on your situation, they will determine when you should go in for an exam. This information is not intended to replace advice given to you by your health care provider. Make sure you discuss any questions you have with your health care provider. Document Revised: 12/20/2020 Document Reviewed: 12/20/2020 Elsevier Patient Education  2023 Elsevier Inc.  

## 2022-06-13 ENCOUNTER — Inpatient Hospital Stay: Payer: Medicaid Other

## 2022-06-13 VITALS — BP 142/67 | HR 119 | Temp 97.2°F | Resp 18

## 2022-06-13 DIAGNOSIS — D509 Iron deficiency anemia, unspecified: Secondary | ICD-10-CM | POA: Diagnosis not present

## 2022-06-13 DIAGNOSIS — D508 Other iron deficiency anemias: Secondary | ICD-10-CM

## 2022-06-13 MED ORDER — SODIUM CHLORIDE 0.9 % IV SOLN
Freq: Once | INTRAVENOUS | Status: AC
  Filled 2022-06-13: qty 250

## 2022-06-13 MED ORDER — SODIUM CHLORIDE 0.9 % IV SOLN
200.0000 mg | Freq: Once | INTRAVENOUS | Status: AC
  Administered 2022-06-13: 200 mg via INTRAVENOUS
  Filled 2022-06-13: qty 200

## 2022-06-14 ENCOUNTER — Other Ambulatory Visit: Payer: Self-pay

## 2022-06-14 ENCOUNTER — Encounter (HOSPITAL_COMMUNITY): Payer: Self-pay | Admitting: Obstetrics & Gynecology

## 2022-06-14 ENCOUNTER — Encounter: Payer: Self-pay | Admitting: Advanced Practice Midwife

## 2022-06-14 ENCOUNTER — Ambulatory Visit (INDEPENDENT_AMBULATORY_CARE_PROVIDER_SITE_OTHER): Payer: Medicaid Other | Admitting: Advanced Practice Midwife

## 2022-06-14 ENCOUNTER — Inpatient Hospital Stay (HOSPITAL_COMMUNITY)
Admission: AD | Admit: 2022-06-14 | Discharge: 2022-06-14 | Disposition: A | Discharge status: Home / Self Care | Payer: Medicaid Other | Attending: Obstetrics & Gynecology | Admitting: Obstetrics & Gynecology

## 2022-06-14 VITALS — BP 138/75 | HR 109 | Wt 199.0 lb

## 2022-06-14 DIAGNOSIS — O99013 Anemia complicating pregnancy, third trimester: Secondary | ICD-10-CM | POA: Diagnosis not present

## 2022-06-14 DIAGNOSIS — O26893 Other specified pregnancy related conditions, third trimester: Secondary | ICD-10-CM | POA: Insufficient documentation

## 2022-06-14 DIAGNOSIS — O99019 Anemia complicating pregnancy, unspecified trimester: Secondary | ICD-10-CM

## 2022-06-14 DIAGNOSIS — R000 Tachycardia, unspecified: Secondary | ICD-10-CM | POA: Insufficient documentation

## 2022-06-14 DIAGNOSIS — Z3A38 38 weeks gestation of pregnancy: Secondary | ICD-10-CM

## 2022-06-14 DIAGNOSIS — D649 Anemia, unspecified: Secondary | ICD-10-CM | POA: Diagnosis not present

## 2022-06-14 DIAGNOSIS — Z3689 Encounter for other specified antenatal screening: Secondary | ICD-10-CM | POA: Diagnosis not present

## 2022-06-14 DIAGNOSIS — G43109 Migraine with aura, not intractable, without status migrainosus: Secondary | ICD-10-CM

## 2022-06-14 DIAGNOSIS — Z3483 Encounter for supervision of other normal pregnancy, third trimester: Secondary | ICD-10-CM

## 2022-06-14 DIAGNOSIS — D509 Iron deficiency anemia, unspecified: Secondary | ICD-10-CM

## 2022-06-14 DIAGNOSIS — O99353 Diseases of the nervous system complicating pregnancy, third trimester: Secondary | ICD-10-CM

## 2022-06-14 LAB — POCT URINALYSIS DIPSTICK OB
Glucose, UA: NEGATIVE
POC,PROTEIN,UA: NEGATIVE

## 2022-06-14 MED ORDER — LACTATED RINGERS IV BOLUS
1000.0000 mL | Freq: Once | INTRAVENOUS | Status: AC
  Administered 2022-06-14: 1000 mL via INTRAVENOUS

## 2022-06-14 NOTE — Progress Notes (Signed)
Routine Prenatal Care Visit  Subjective  Rebecca Duran is a 26 y.o. G2P1001 at [redacted]w[redacted]d being seen today for ongoing prenatal care.  She is currently monitored for the following issues for this low-risk pregnancy and has Migraine with aura and without status migrainosus, not intractable; Supervision of normal pregnancy; Maternal anemia in pregnancy, antepartum; Absolute anemia; and IDA (iron deficiency anemia) on their problem list.  ----------------------------------------------------------------------------------- Patient reports she is noticing some improvements in her energy level. Her third iron infusion was yesterday. Contractions: Irregular. Vag. Bleeding: None.  Movement: Present. Leaking Fluid denies.  ----------------------------------------------------------------------------------- The following portions of the patient's history were reviewed and updated as appropriate: allergies, current medications, past family history, past medical history, past social history, past surgical history and problem list. Problem list updated.  Objective  Blood pressure 138/75, pulse (!) 109, weight 199 lb (90.3 kg), last menstrual period 09/21/2021. Pregravid weight 163 lb (73.9 kg) Total Weight Gain 36 lb (16.3 kg) Urinalysis: Urine Protein Negative  Urine Glucose Negative  Fetal Status: Fetal Heart Rate (bpm): 170s   Movement: Present  Presentation: Vertex  Listened to FHR for 5 minutes with doppler- continuously in 170s/180s  General:  Alert, oriented and cooperative. Patient is in no acute distress.  Skin: Skin is warm and dry. No rash noted.   Cardiovascular: Normal heart rate noted  Respiratory: Normal respiratory effort, no problems with respiration noted  Abdomen: Soft, gravid, appropriate for gestational age. Pain/Pressure: Present     Pelvic:  Cervical exam performed Dilation: Closed      Extremities: Normal range of motion.  Edema: None  Mental Status: Normal mood and affect. Normal  behavior. Normal judgment and thought content.   Assessment   26 y.o. G2P1001 at [redacted]w[redacted]d by  06/28/2022, by Last Menstrual Period presenting for routine prenatal visit  Plan   [email or web address] Problems (from 10/25/21 to present)    Problem Noted Resolved   Maternal anemia in pregnancy, antepartum 04/30/2022 by Mirna Mires, CNM No   Supervision of normal pregnancy 11/17/2021 by Tresea Mall, CNM No   Overview Addendum 04/24/2022  5:14 PM by Mirna Mires, CNM     Nursing Staff Provider  Office Location  [facility] Dating  By LMP c/w 10w  Language  English Anatomy US  Normal female  Flu Vaccine   Genetic Screen  NIPS:   TDaP vaccine    Hgb A1C or  GTT Early : NA Third trimester :   Covid    LAB RESULTS   Rhogam   Blood Type A/Positive/-- (04/14 1130)   Feeding Plan Breast Antibody Negative (04/14 1130)  Contraception  Rubella 3.65 (04/14 1130)  Circumcision  RPR Non Reactive (04/14 1130)   Pediatrician   HBsAg Negative (04/14 1130)   Support Person Dajuan HIV Non Reactive (04/14 1130)  Prenatal Classes  Varicella     GBS  (For PCN allergy, check sensitivities)   BTL Consent     VBAC Consent NA Pap  2021 negative    Hgb Electro    Pelvis Tested 6#13oz CF      SMA                 FHR tachycardic: recommend go to L&D for extended monitoring to determine if prolonged accel vs tachycardia   Term labor symptoms and general obstetric precautions including but not limited to vaginal bleeding, contractions, leaking of fluid and fetal movement were reviewed in detail with the patient. Please refer to After Visit Summary for other counseling  recommendations.   Return in about 1 week (around 06/21/2022) for rob.  Rod Can, CNM 06/14/2022 5:18 PM

## 2022-06-14 NOTE — MAU Note (Signed)
Rebecca Duran is a 26 y.o. at [redacted]w[redacted]d here in MAU reporting: was sent over from the [facility] because the "baby's heart rate is to high" and she needs monitoring and an u/s. DFM today. No pain, bleeding, or LOF.  Onset of complaint: today  Pain score: 0/10  Vitals:   06/14/22 1803  BP: 117/67  Pulse: (!) 118  Resp: 16  Temp: 98.1 F (36.7 C)  SpO2: 98%     FHT:155  Lab orders placed from triage: none

## 2022-06-14 NOTE — MAU Provider Note (Signed)
History     CSN: [id number]  Arrival date and time: 06/14/22 1737   Event Date/Time   First Provider Initiated Contact with Patient 06/14/22 1918      Chief Complaint  Patient presents with   Fetal Monitoring   HPI Rebecca Duran is a 26 y.o. G2P1001 at [redacted]w[redacted]d who presents to MAU from her prenatal office. Patient's baby was assessed to have persistent tachycardia by Doppler during patient's appointment around 4pm today.  Patient's pregnancy is c/b anemia. She is s/p outpatient Hematology consult. She completed 3 of 5 planned Iron infusions yesterday.   Patient receives care with [facility] but plans to delivery at [facility].  OB History     Gravida  2   Para  1   Term  1   Preterm      AB      Living  1      SAB      IAB      Ectopic      Multiple  0   Live Births  1           Past Medical History:  Diagnosis Date   IDA (iron deficiency anemia)     Past Surgical History:  Procedure Laterality Date   NO PAST SURGERIES      Family History  Problem Relation Age of Onset   Healthy Mother    Cancer - Other Mother    Healthy Father    Cancer Neg Hx    Diabetes Neg Hx    Hypertension Neg Hx     Social History   Tobacco Use   Smoking status: Never   Smokeless tobacco: Never  Vaping Use   Vaping Use: Never used  Substance Use Topics   Alcohol use: Not Currently    Comment: occasionally   Drug use: No    Allergies: No Known Allergies  Medications Prior to Admission  Medication Sig Dispense Refill Last Dose   ferrous fumarate (HEMOCYTE - 106 MG FE) 325 (106 Fe) MG TABS tablet Take 1 tablet (106 mg of iron total) by mouth daily. 30 tablet 6 06/13/2022   Prenatal Vit-Fe Fumarate-FA (PRENATAL MULTIVITAMIN) TABS tablet Take 1 tablet by mouth daily at 12 noon.   06/13/2022    Review of Systems  Constitutional:  Positive for fatigue.  All other systems reviewed and are negative.  Physical Exam   Blood pressure 117/67, pulse (!) 118,  temperature 98.1 F (36.7 C), temperature source Oral, resp. rate 16, height 5' 4.5" (1.638 m), weight 90.7 kg, last menstrual period 09/21/2021, SpO2 98 %.  Physical Exam Vitals and nursing note reviewed. Exam conducted with a chaperone present.  Constitutional:      Appearance: Normal appearance. She is not ill-appearing.  Cardiovascular:     Rate and Rhythm: Tachycardia present.  Abdominal:     Comments: Gravid  Skin:    Capillary Refill: Capillary refill takes less than 2 seconds.  Neurological:     Mental Status: She is alert and oriented to person, place, and time.  Psychiatric:        Mood and Affect: Mood normal.        Behavior: Behavior normal.        Thought Content: Thought content normal.        Judgment: Judgment normal.     MAU Course  Procedures  MDM --Reactive tracing: baseline 155, mod var, + accels, no decels --Toco: rare contractions --EMR reviewed. Patient with maternal tachycardia at  baseline per prenatal records since September   Patient Vitals for the past 24 hrs:  BP Temp Temp src Pulse Resp SpO2 Height Weight  06/14/22 1803 117/67 98.1 F (36.7 C) Oral (!) 118 16 98 % -- --  06/14/22 1757 -- -- -- -- -- -- 5' 4.5" (1.638 m) 90.7 kg    Assessment and Plan  --26 y.o. G2P1001  --Reactive tracing --Maternal tachycardia 2/2 anemia --Patient endorsing fetal movement throughout MAU encounter --Discharge home in stable condition  Darlina Rumpf, [place], MSN, CNM

## 2022-06-19 ENCOUNTER — Inpatient Hospital Stay (HOSPITAL_COMMUNITY)
Admission: AD | Admit: 2022-06-19 | Discharge: 2022-06-21 | DRG: 807 | Disposition: A | Discharge status: Home / Self Care | Payer: Medicaid Other | Attending: Obstetrics and Gynecology | Admitting: Obstetrics and Gynecology

## 2022-06-19 ENCOUNTER — Encounter (HOSPITAL_COMMUNITY): Payer: Self-pay | Admitting: Obstetrics and Gynecology

## 2022-06-19 ENCOUNTER — Other Ambulatory Visit: Payer: Self-pay

## 2022-06-19 DIAGNOSIS — Z3483 Encounter for supervision of other normal pregnancy, third trimester: Principal | ICD-10-CM

## 2022-06-19 DIAGNOSIS — O9902 Anemia complicating childbirth: Secondary | ICD-10-CM | POA: Diagnosis not present

## 2022-06-19 DIAGNOSIS — Z3490 Encounter for supervision of normal pregnancy, unspecified, unspecified trimester: Secondary | ICD-10-CM

## 2022-06-19 DIAGNOSIS — Z3A38 38 weeks gestation of pregnancy: Secondary | ICD-10-CM | POA: Diagnosis not present

## 2022-06-19 DIAGNOSIS — D509 Iron deficiency anemia, unspecified: Secondary | ICD-10-CM | POA: Diagnosis present

## 2022-06-19 DIAGNOSIS — O99019 Anemia complicating pregnancy, unspecified trimester: Secondary | ICD-10-CM

## 2022-06-19 DIAGNOSIS — O368390 Maternal care for abnormalities of the fetal heart rate or rhythm, unspecified trimester, not applicable or unspecified: Secondary | ICD-10-CM | POA: Diagnosis present

## 2022-06-19 LAB — CBC
HCT: 33.2 % — ABNORMAL LOW (ref 36.0–46.0)
Hemoglobin: 9.8 g/dL — ABNORMAL LOW (ref 12.0–15.0)
MCH: 23.8 pg — ABNORMAL LOW (ref 26.0–34.0)
MCHC: 29.5 g/dL — ABNORMAL LOW (ref 30.0–36.0)
MCV: 80.8 fL (ref 80.0–100.0)
Platelets: 308 10*3/uL (ref 150–400)
RBC: 4.11 MIL/uL (ref 3.87–5.11)
RDW: 22.3 % — ABNORMAL HIGH (ref 11.5–15.5)
WBC: 15.6 10*3/uL — ABNORMAL HIGH (ref 4.0–10.5)
nRBC: 0.3 % — ABNORMAL HIGH (ref 0.0–0.2)

## 2022-06-19 LAB — TSH: TSH: 1.23 u[IU]/mL (ref 0.350–4.500)

## 2022-06-19 MED ORDER — FENTANYL CITRATE (PF) 100 MCG/2ML IJ SOLN
50.0000 ug | INTRAMUSCULAR | Status: DC | PRN
  Administered 2022-06-19 (×2): 100 ug via INTRAVENOUS
  Filled 2022-06-19 (×2): qty 2

## 2022-06-19 MED ORDER — OXYTOCIN-SODIUM CHLORIDE 30-0.9 UT/500ML-% IV SOLN: 2.5000 [IU]/h | INTRAVENOUS | Status: DC

## 2022-06-19 MED ORDER — ACETAMINOPHEN 325 MG PO TABS: 650.0000 mg | ORAL_TABLET | ORAL | Status: DC | PRN

## 2022-06-19 MED ORDER — ONDANSETRON HCL 4 MG/2ML IJ SOLN: 4.0000 mg | Freq: Four times a day (QID) | INTRAMUSCULAR | Status: DC | PRN

## 2022-06-19 MED ORDER — BENZOCAINE-MENTHOL 20-0.5 % EX AERO
1.0000 | INHALATION_SPRAY | CUTANEOUS | Status: DC | PRN
  Filled 2022-06-19: qty 56

## 2022-06-19 MED ORDER — TERBUTALINE SULFATE 1 MG/ML IJ SOLN: 0.2500 mg | Freq: Once | INTRAMUSCULAR | Status: DC | PRN

## 2022-06-19 MED ORDER — LIDOCAINE HCL (PF) 1 % IJ SOLN
30.0000 mL | INTRAMUSCULAR | Status: DC | PRN
  Administered 2022-06-19: 30 mL via SUBCUTANEOUS
  Filled 2022-06-19: qty 30

## 2022-06-19 MED ORDER — LACTATED RINGERS IV SOLN: 500.0000 mL | INTRAVENOUS | Status: DC | PRN

## 2022-06-19 MED ORDER — SOD CITRATE-CITRIC ACID 500-334 MG/5ML PO SOLN: 30.0000 mL | ORAL | Status: DC | PRN

## 2022-06-19 MED ORDER — LACTATED RINGERS IV SOLN: INTRAVENOUS | Status: DC

## 2022-06-19 MED ORDER — OXYTOCIN BOLUS FROM INFUSION
333.0000 mL | Freq: Once | INTRAVENOUS | Status: AC
  Administered 2022-06-19: 333 mL via INTRAVENOUS

## 2022-06-19 MED ORDER — OXYTOCIN-SODIUM CHLORIDE 30-0.9 UT/500ML-% IV SOLN
1.0000 m[IU]/min | INTRAVENOUS | Status: DC
  Administered 2022-06-19: 2 m[IU]/min via INTRAVENOUS
  Filled 2022-06-19: qty 500

## 2022-06-19 MED FILL — Iron Sucrose Inj 20 MG/ML (Fe Equiv): INTRAVENOUS | Qty: 10 | Status: AC

## 2022-06-19 NOTE — Progress Notes (Signed)
LABOR PROGRESS NOTE  Rebecca Duran is a 26 y.o. G2P1001 at [redacted]w[redacted]d  admitted for labor augmentation in the setting of fetal tachycardia.   Subjective: She is feeling her contractions more intensely.   Objective: BP 129/67 (BP Location: Right Arm)   Pulse (!) 118   Temp 97.9 F (36.6 C) (Oral)   Resp 20   Ht 5' 4.5" (1.638 m)   Wt 91.2 kg   LMP 09/21/2021 (Exact Date)   SpO2 100%   BMI 33.99 kg/m  or  Vitals:   06/19/22 1442  BP: 129/67  Pulse: (!) 118  Resp: 20  Temp: 97.9 F (36.6 C)  TempSrc: Oral  SpO2: 100%  Weight: 91.2 kg  Height: 5' 4.5" (1.638 m)    SVE Dilation: 4 Effacement (%): 80 Station: -3 Presentation: Vertex Exam by:: Dr. Gwendolyn Lima FHT: baseline rate 160, moderate varibility, +acel, -decel Toco: every 5-7 minutes   Labs: Lab Results  Component Value Date   WBC 15.6 (H) 06/19/2022   HGB 9.8 (L) 06/19/2022   HCT 33.2 (L) 06/19/2022   MCV 80.8 06/19/2022   PLT 308 06/19/2022    Patient Active Problem List   Diagnosis Date Noted   Fetal tachycardia affecting management of mother 06/19/2022   Absolute anemia 05/31/2022   IDA (iron deficiency anemia) 05/31/2022   Maternal anemia in pregnancy, antepartum 04/30/2022   Supervision of normal pregnancy 11/17/2021   Migraine with aura and without status migrainosus, not intractable 11/03/2020    Assessment / Plan: 26 y.o. G2P1001 at [redacted]w[redacted]d here for IOL s/t fetal tachycardia   Labor: starting pitocin 2x2, continue to monitor  Fetal Wellbeing:  Cat 1  Pain Control:  Starting nitrous, plan for IV fentanyl  Anticipated MOD:  SVD  Lowry Ram, MD  PGY-1, [facility]  06/19/2022, 7:08 PM

## 2022-06-19 NOTE — Discharge Summary (Signed)
Postpartum Discharge Summary  Date of Service updated***     [name] DOB: November 07, 1995 MRN: [id number]  Date of admission: 06/19/2022 Delivery date:06/19/2022  Delivering provider: Concepcion Living  Date of discharge: 06/19/2022  Admitting diagnosis: Fetal tachycardia affecting management of mother [O36.8390] Intrauterine pregnancy: [redacted]w[redacted]d    Secondary diagnosis:  Principal Problem:   Fetal tachycardia affecting management of mother Active Problems:   Supervision of normal pregnancy   Vaginal delivery  Additional problems: None     Discharge diagnosis: Term Pregnancy Delivered and Anemia                                              Post partum procedures:{Postpartum procedures:23558} Augmentation: Pitocin Complications: None  Hospital course: Induction of Labor With Vaginal Delivery   26y.o. yo G2P1001 at 356w5das admitted to the hospital 06/19/2022 for induction of labor.  Indication for induction:  fetal tachycardia .  Patient had an uncomplicated labor course.  Membrane Rupture Time/Date: 10:18 PM ,06/19/2022   Delivery Method:Vaginal, Spontaneous  Episiotomy: None  Lacerations:  1st degree  Details of delivery can be found in separate delivery note.  Patient had a postpartum course complicated by***. Patient is discharged home 06/19/22.  Newborn Data: Birth date:06/19/2022  Birth time:10:34 PM  Gender:Female  Living status:Living  Apgars:9 ,9  Weight:   Magnesium Sulfate received: {Mag received:30440022} BMZ received: {BMZ received:30440023} Rhophylac:{Rhophylac received:30440032} MMRDE:{[id number]}-DaP:{Tdap:23962} Flu: {F{JSH:70263}ransfusion:{Transfusion received:30440034}  Physical exam  Vitals:   06/19/22 1442 06/19/22 1930  BP: 129/67 122/67  Pulse: (!) 118 (!) 113  Resp: 20   Temp: 97.9 F (36.6 C) 98.5 F (36.9 C)  TempSrc: Oral Oral  SpO2: 100%   Weight: 91.2 kg   Height: 5' 4.5" (1.638 m)    General: {Exam;  general:21111117} Lochia: {Desc; appropriate/inappropriate:30686::"appropriate"} Uterine Fundus: {Desc; firm/soft:30687} Incision: {Exam; incision:21111123} DVT Evaluation: {Exam; dvZCH:8850277}abs: Lab Results  Component Value Date   WBC 15.6 (H) 06/19/2022   HGB 9.8 (L) 06/19/2022   HCT 33.2 (L) 06/19/2022   MCV 80.8 06/19/2022   PLT 308 06/19/2022      Latest Ref Rng & Units 05/02/2022   12:20 PM  CMP  Glucose 70 - 99 mg/dL 75   BUN 6 - 20 mg/dL 5   Creatinine 0.44 - 1.00 mg/dL 0.52   Sodium 135 - 145 mmol/L 137   Potassium 3.5 - 5.1 mmol/L 3.5   Chloride 98 - 111 mmol/L 107   CO2 22 - 32 mmol/L 20   Calcium 8.9 - 10.3 mg/dL 8.7   Total Protein 6.5 - 8.1 g/dL 6.6   Total Bilirubin 0.3 - 1.2 mg/dL 1.0   Alkaline Phos 38 - 126 U/L 144   AST 15 - 41 U/L 20   ALT 0 - 44 U/L 12    Edinburgh Score:     No data to display           After visit meds:  Allergies as of 06/19/2022   No Known Allergies   [facility]        Discharge home in stable condition Infant Feeding: {Baby feeding:23562} Infant Disposition:{CHL IP OB HOME WITH MOAJOINO:67672}ischarge instruction: per After Visit Summary and Postpartum booklet. Activity: Advance as tolerated. Pelvic rest for 6 weeks.  Diet: {OB  XHBZ:16967893} Future Appointments: Future Appointments  Date Time Provider [place]  06/20/2022  1:00 PM [facility] INFUSION CHAIR 20 [facility] None  06/21/2022  1:55 PM Dominic, Nunzio Cobbs, CNM [facility] None  06/27/2022  1:00 PM [facility] INFUSION CHAIR 11 [facility] None  07/04/2022  1:00 PM [facility] INFUSION CHAIR 16 [facility] None  10/04/2022  1:45 PM [facility] LAB [facility] None  10/04/2022  2:15 PM Earlie Server, MD [facility] None  10/04/2022  2:45 PM [facility] INFUSION CHAIR 3 [facility] None   Follow up Visit:   Please schedule this patient for a In person postpartum visit in 4 weeks with the following provider: Any  provider. Additional Postpartum F/U: none   Low risk pregnancy complicated by:  none  Delivery mode:  Vaginal, Spontaneous  Anticipated Birth Control:  Unsure   06/19/2022 Concepcion Living, MD

## 2022-06-19 NOTE — Progress Notes (Signed)
Labor Progress Note Katilynn Sinkler is a 26 y.o. G2P1001 at [redacted]w[redacted]d presented for IOL/augmentation for fetal tachycardia. S: Patient is contracting painfully, in hands/knees position. Feeling pressure with contractions.   O:  BP 122/67   Pulse (!) 113   Temp 98.5 F (36.9 C) (Oral)   Resp 20   Ht 5' 4.5" (1.638 m)   Wt 91.2 kg   LMP 09/21/2021 (Exact Date)   SpO2 100%   BMI 33.99 kg/m  EFM: tachycardic baseline 170 /mod variability/no accels, questionable intermittent late decels (likely maternal tachycardia due to pain)   CVE: Dilation: 7 Effacement (%): 90 Station: -1 Presentation: Vertex Exam by:: Dr Elouise Munroe   A&P: 26 y.o. G2P1001 [redacted]w[redacted]d here for IOL for fetal tachycardia.  #Labor: Progressing well. Making cervical change. Declines AROM.  #Pain: significant w/ contractions, prn fentanyl #FWB: category II #GBS negative #Anemia: received venofer prenatally, hgb on admission improved to 9.8  Cecilio Asper, [place] for [place] 9:54 PM

## 2022-06-19 NOTE — MAU Note (Signed)
Rebecca Duran is a 26 y.o. at [redacted]w[redacted]d here in MAU reporting: been having contractions since 0400, now 2-5 min, sometimes 10. No bleeding or leaking. Pinkish d/c since early this morning.  Reports +FM. Was closed when checked last  Onset of complaint: 0400 Pain score: 9 Vitals:   06/19/22 1442  BP: 129/67  Pulse: (!) 118  Resp: 20  Temp: 97.9 F (36.6 C)  SpO2: 100%     FHT:155 Lab orders placed from triage:  none

## 2022-06-19 NOTE — H&P (Signed)
OBSTETRIC ADMISSION HISTORY AND PHYSICAL  Rebecca Duran is a 26 y.o. female G2P1001 with IUP at [redacted]w[redacted]d by LMP presenting for labor augmentation in the setting of fetal tachycardia. She reports +FMs, No LOF, no VB, no blurry vision, headaches or peripheral edema, and RUQ pain.  She plans on breast feeding. She is unsure for birth control. She received her [facility] care at  [facility]    Dating: By LMP --->  Estimated Date of Delivery: 06/28/22  Sono:    @[redacted]w[redacted]d , CWD, normal anatomy, cephalic presentation, posterior  lie, 1632g, 59% EFW Previous low lying, on this no evidence of previa or low lying placenta   Prenatal History/Complications:  Anemia- Venoferx3, Hgb on 10/12 8.7    Past Medical History: Past Medical History:  Diagnosis Date   IDA (iron deficiency anemia)     Past Surgical History: Past Surgical History:  Procedure Laterality Date   NO PAST SURGERIES      Obstetrical History: OB History     Gravida  2   Para  1   Term  1   Preterm      AB      Living  1      SAB      IAB      Ectopic      Multiple  0   Live Births  1           Social History Social History   Socioeconomic History   Marital status: Single    Spouse name: Not on file   Number of children: Not on file   Years of education: Not on file   Highest education level: Not on file  Occupational History   Not on file  Tobacco Use   Smoking status: Never   Smokeless tobacco: Never  Vaping Use   Vaping Use: Never used  Substance and Sexual Activity   Alcohol use: Not Currently    Comment: occasionally   Drug use: No   Sexual activity: Yes    Birth control/protection: None  Other Topics Concern   Not on file  Social History Narrative   Not on file   Social Determinants of Health   Financial Resource Strain: Not on file  Food Insecurity: Not on file  Transportation Needs: Not on file  Physical Activity: Not on file  Stress: Not on file  Social Connections: Not on  file    Family History: Family History  Problem Relation Age of Onset   Healthy Mother    Cancer - Other Mother    Healthy Father    Cancer Neg Hx    Diabetes Neg Hx    Hypertension Neg Hx     Allergies: No Known Allergies  Medications Prior to Admission  Medication Sig Dispense Refill Last Dose   ferrous fumarate (HEMOCYTE - 106 MG FE) 325 (106 Fe) MG TABS tablet Take 1 tablet (106 mg of iron total) by mouth daily. 30 tablet 6    Prenatal Vit-Fe Fumarate-FA (PRENATAL MULTIVITAMIN) TABS tablet Take 1 tablet by mouth daily at 12 noon.        Review of Systems   All systems reviewed and negative except as stated in HPI  Blood pressure 129/67, pulse (!) 118, temperature 97.9 F (36.6 C), temperature source Oral, resp. rate 20, height 5' 4.5" (1.638 m), weight 91.2 kg, last menstrual period 09/21/2021, SpO2 100 %. General appearance: alert and cooperative, breathing through contractions Lungs: clear to auscultation bilaterally Heart: regular rate and  rhythm Abdomen: soft, non-tender; bowel sounds normal Extremities: Homans sign is negative, no sign of DVT Presentation: cephalic Fetal monitoring Baseline 155 bpm, moderate variability, +accels, - decels Uterine activity every 3 minutes  Dilation: 3 Effacement (%): 70 Station: -2 Exam by:: Andres Ege, RN   Prenatal labs: ABO, Rh: A/Positive/-- (04/14 1130) Antibody: Negative (04/14 1130) Rubella: 3.65 (04/14 1130) RPR: Non Reactive (09/08 1457)  HBsAg: Negative (04/14 1130)  HIV: Non Reactive (09/08 1457)  GBS: Negative/-- (10/12 0911)  1 hr Glucola not done  Genetic screening  declined NIPS Anatomy US wnl  Prenatal Transfer Tool  Maternal Diabetes: Unknown  Genetic Screening: Declined Maternal Ultrasounds/Referrals: Normal Fetal Ultrasounds or other Referrals:  None Maternal Substance Abuse:  No Significant Maternal Medications:  IV venofer x 3  Significant Maternal Lab Results:  Group B Strep  negative Number of Prenatal Visits:greater than 3 verified prenatal visits Other Comments:  None  No results found for this or any previous visit (from the past 24 hour(s)).  Patient Active Problem List   Diagnosis Date Noted   Absolute anemia 05/31/2022   IDA (iron deficiency anemia) 05/31/2022   Maternal anemia in pregnancy, antepartum 04/30/2022   Supervision of normal pregnancy 11/17/2021   Migraine with aura and without status migrainosus, not intractable 11/03/2020    Assessment/Plan:  Rebecca Duran is a 26 y.o. G2P1001 at [redacted]w[redacted]d here for augmentation of labor for fetal tachycardia.   #Labor: Having contractions, continue to monitor, add pitocin if needed #Pain: Wants to go natural, and may try nitrous  #FWB: Cat 1  #ID:  GBS negative #MOF: Breast #MOC: unsure  #Circ:  N/a # Anemia: hgb 9.8 today, will use txa after delivery   Lowry Ram, MD  06/19/2022, 3:28 PM

## 2022-06-20 ENCOUNTER — Inpatient Hospital Stay: Payer: Medicaid Other | Attending: Oncology

## 2022-06-20 ENCOUNTER — Encounter (HOSPITAL_COMMUNITY): Payer: Self-pay | Admitting: Obstetrics and Gynecology

## 2022-06-20 LAB — CBC
HCT: 30.2 % — ABNORMAL LOW (ref 36.0–46.0)
Hemoglobin: 9.2 g/dL — ABNORMAL LOW (ref 12.0–15.0)
MCH: 24.5 pg — ABNORMAL LOW (ref 26.0–34.0)
MCHC: 30.5 g/dL (ref 30.0–36.0)
MCV: 80.3 fL (ref 80.0–100.0)
Platelets: 288 10*3/uL (ref 150–400)
RBC: 3.76 MIL/uL — ABNORMAL LOW (ref 3.87–5.11)
RDW: 22.2 % — ABNORMAL HIGH (ref 11.5–15.5)
WBC: 22.4 10*3/uL — ABNORMAL HIGH (ref 4.0–10.5)
nRBC: 0.1 % (ref 0.0–0.2)

## 2022-06-20 LAB — RPR: RPR Ser Ql: NONREACTIVE

## 2022-06-20 LAB — TYPE AND SCREEN
ABO/RH(D): A POS
Antibody Screen: NEGATIVE

## 2022-06-20 MED ORDER — DIBUCAINE (PERIANAL) 1 % EX OINT: 1.0000 | TOPICAL_OINTMENT | CUTANEOUS | Status: DC | PRN

## 2022-06-20 MED ORDER — OXYCODONE HCL 5 MG PO TABS: 5.0000 mg | ORAL_TABLET | ORAL | Status: DC | PRN

## 2022-06-20 MED ORDER — ACETAMINOPHEN 325 MG PO TABS
650.0000 mg | ORAL_TABLET | ORAL | Status: DC | PRN
  Administered 2022-06-20 – 2022-06-21 (×3): 650 mg via ORAL
  Filled 2022-06-20 (×3): qty 2

## 2022-06-20 MED ORDER — PRENATAL MULTIVITAMIN CH
1.0000 | ORAL_TABLET | Freq: Every day | ORAL | Status: DC
  Administered 2022-06-20: 1 via ORAL
  Filled 2022-06-20: qty 1

## 2022-06-20 MED ORDER — SENNOSIDES-DOCUSATE SODIUM 8.6-50 MG PO TABS
2.0000 | ORAL_TABLET | Freq: Every day | ORAL | Status: DC
  Administered 2022-06-20 – 2022-06-21 (×2): 2 via ORAL
  Filled 2022-06-20 (×2): qty 2

## 2022-06-20 MED ORDER — FERROUS FUMARATE 324 (106 FE) MG PO TABS
1.0000 | ORAL_TABLET | Freq: Every day | ORAL | Status: DC
  Administered 2022-06-20 – 2022-06-21 (×2): 106 mg via ORAL
  Filled 2022-06-20 (×2): qty 1

## 2022-06-20 MED ORDER — DIPHENHYDRAMINE HCL 25 MG PO CAPS: 25.0000 mg | ORAL_CAPSULE | Freq: Four times a day (QID) | ORAL | Status: DC | PRN

## 2022-06-20 MED ORDER — BENZOCAINE-MENTHOL 20-0.5 % EX AERO: 1.0000 | INHALATION_SPRAY | CUTANEOUS | Status: DC | PRN

## 2022-06-20 MED ORDER — ZOLPIDEM TARTRATE 5 MG PO TABS: 5.0000 mg | ORAL_TABLET | Freq: Every evening | ORAL | Status: DC | PRN

## 2022-06-20 MED ORDER — COCONUT OIL OIL: 1.0000 | TOPICAL_OIL | Status: DC | PRN

## 2022-06-20 MED ORDER — WITCH HAZEL-GLYCERIN EX PADS: 1.0000 | MEDICATED_PAD | CUTANEOUS | Status: DC | PRN

## 2022-06-20 MED ORDER — IBUPROFEN 600 MG PO TABS
ORAL_TABLET | ORAL | Status: AC
  Administered 2022-06-20: 600 mg
  Filled 2022-06-20: qty 1

## 2022-06-20 MED ORDER — TETANUS-DIPHTH-ACELL PERTUSSIS 5-2.5-18.5 LF-MCG/0.5 IM SUSY: 0.5000 mL | PREFILLED_SYRINGE | Freq: Once | INTRAMUSCULAR | Status: DC

## 2022-06-20 MED ORDER — OXYCODONE HCL 5 MG PO TABS: 10.0000 mg | ORAL_TABLET | ORAL | Status: DC | PRN

## 2022-06-20 MED ORDER — ACETAMINOPHEN 325 MG PO TABS
ORAL_TABLET | ORAL | Status: AC
  Administered 2022-06-20: 650 mg
  Filled 2022-06-20: qty 2

## 2022-06-20 MED ORDER — IBUPROFEN 600 MG PO TABS
600.0000 mg | ORAL_TABLET | Freq: Four times a day (QID) | ORAL | Status: DC
  Administered 2022-06-20 – 2022-06-21 (×5): 600 mg via ORAL
  Filled 2022-06-20 (×5): qty 1

## 2022-06-20 MED ORDER — ONDANSETRON HCL 4 MG/2ML IJ SOLN: 4.0000 mg | INTRAMUSCULAR | Status: DC | PRN

## 2022-06-20 MED ORDER — ONDANSETRON HCL 4 MG PO TABS: 4.0000 mg | ORAL_TABLET | ORAL | Status: DC | PRN

## 2022-06-20 MED ORDER — SIMETHICONE 80 MG PO CHEW: 80.0000 mg | CHEWABLE_TABLET | ORAL | Status: DC | PRN

## 2022-06-20 NOTE — Lactation Note (Signed)
This note was copied from a baby's chart. Lactation Consultation Note  Patient Name: Girl Meilah Delrosario [id number] Date: 06/20/2022   Age:26 hours Mom declines Lactation services. Maternal Data    Feeding    LATCH Score Latch: Grasps breast easily, tongue down, lips flanged, rhythmical sucking.  Audible Swallowing: Spontaneous and intermittent  Type of Nipple: Everted at rest and after stimulation  Comfort (Breast/Nipple): Soft / non-tender  Hold (Positioning): No assistance needed to correctly position infant at breast.  LATCH Score: 10   Lactation Tools Discussed/Used    Interventions    Discharge    Consult Status Consult Status: Complete    Libbey Duce G 06/20/2022, 12:24 AM

## 2022-06-20 NOTE — Progress Notes (Signed)
Post Partum Day 1 Subjective: no complaints, up ad lib, voiding, tolerating PO, and feeling well.  Feeding going ok. No lightheadedness or dyspnea. Bleeding has slowed to be like a heavy period.   Objective: Blood pressure 117/61, pulse (!) 106, temperature 98.1 F (36.7 C), temperature source Oral, resp. rate 16, height 5' 4.5" (1.638 m), weight 91.2 kg, last menstrual period 09/21/2021, SpO2 99 %, unknown if currently breastfeeding.  Physical Exam:  General: alert and no distress Lochia: appropriate Uterine Fundus: firm Incision: n/a DVT Evaluation: No evidence of DVT seen on physical exam. No cords or calf tenderness. No significant calf/ankle edema.  Recent Labs    06/19/22 1530  HGB 9.8*  HCT 33.2*    Assessment/Plan: Plan for discharge tomorrow, Breastfeeding, Lactation consult, and Contraception desires POPs. Discussed importance of consistent adherence at same time daily.   Am hgb 9.2 from 9.8. Asymptomatic. Continue po iron.    LOS: 1 day   Cecilio Asper, MD 06/20/2022, 6:26 AM

## 2022-06-21 ENCOUNTER — Encounter: Payer: Medicaid Other | Admitting: Licensed Practical Nurse

## 2022-06-21 MED ORDER — IBUPROFEN 600 MG PO TABS: 600.0000 mg | ORAL_TABLET | Freq: Four times a day (QID) | ORAL | 0 refills | Status: AC

## 2022-06-21 NOTE — Progress Notes (Addendum)
Post Partum Day #2 Subjective: no complaints. Pt ambulating, voiding, tolerating PO, pain well-controlled. Denies N/V, lightheadedness, dyspnea.  Objective: Blood pressure 122/78, pulse 93, temperature 98.3 F (36.8 C), temperature source Oral, resp. rate 18, height 5' 4.5" (1.638 m), weight 91.2 kg, last menstrual period 09/21/2021, SpO2 100 %, unknown if currently breastfeeding.  Physical Exam:  General: alert, cooperative, and appears stated age Lochia: appropriate Uterine Fundus: firm DVT Evaluation: No evidence of DVT seen on physical exam.  Recent Labs    06/19/22 1530 06/20/22 0619  HGB 9.8* 9.2*  HCT 33.2* 30.2*    Assessment/Plan: - Discharge home - PPD#2, doing well. Meeting all milestones - Hgb appropriate at 9.2 this AM. Was 9.8. Continue PO iron. -  LOS: 2 days   Brenn A Bolding, Student-PA 06/21/2022, 8:40 AM    Attestation of CNM Supervision of Student: Evaluation and management procedures were performed by the student under my supervision. I was immediately available for direct supervision, assistance and direction throughout this encounter.  I also confirm that I have verified the information documented in the resident's note, and that I have also personally reperformed the pertinent components of the physical exam and all of the medical decision making activities.  I have also made any necessary editorial changes.  Discharge home in stable condition Continue PO iron and PNV F/u in 4 weeks at [facility] for postpartum visit  Renee Harder, CNM 06/21/2022 9:39 AM

## 2022-06-26 MED FILL — Iron Sucrose Inj 20 MG/ML (Fe Equiv): INTRAVENOUS | Qty: 10 | Status: AC

## 2022-06-27 ENCOUNTER — Inpatient Hospital Stay: Payer: Medicaid Other

## 2022-06-30 ENCOUNTER — Telehealth (HOSPITAL_COMMUNITY): Payer: Self-pay

## 2022-06-30 NOTE — Telephone Encounter (Signed)
No answer. No voicemail option available.  Marcelino Duster [facility] 06/30/22,1527

## 2022-07-03 MED FILL — Iron Sucrose Inj 20 MG/ML (Fe Equiv): INTRAVENOUS | Qty: 10 | Status: AC

## 2022-07-04 ENCOUNTER — Inpatient Hospital Stay: Payer: Medicaid Other

## 2022-07-17 ENCOUNTER — Encounter: Payer: Self-pay | Admitting: Oncology

## 2022-07-26 ENCOUNTER — Telehealth: Payer: Self-pay | Admitting: Obstetrics and Gynecology

## 2022-07-26 NOTE — Telephone Encounter (Signed)
I contacted patient via phone. I left message for patient to call back to confirm scheduled 6 week PP with Dr Logan Bores for 12/26 at 3:15 pm.

## 2022-07-30 ENCOUNTER — Encounter: Payer: Self-pay | Admitting: Oncology

## 2022-08-04 ENCOUNTER — Encounter: Payer: Self-pay | Admitting: Oncology

## 2022-08-10 ENCOUNTER — Encounter: Payer: Self-pay | Admitting: Oncology

## 2022-08-14 ENCOUNTER — Ambulatory Visit: Payer: Medicaid Other | Admitting: Obstetrics and Gynecology

## 2022-08-15 ENCOUNTER — Telehealth: Payer: Self-pay | Admitting: Obstetrics and Gynecology

## 2022-08-15 NOTE — Telephone Encounter (Signed)
Reached out to pt to reschedule 6 week PP visit that was scheduled with Dr. Logan Bores on 08/14/22 at 3:15.  Left  message for pt to call back to reschedule.

## 2022-08-16 ENCOUNTER — Encounter: Payer: Self-pay | Admitting: Obstetrics and Gynecology

## 2022-08-16 NOTE — Telephone Encounter (Signed)
Reached out to pt (2x) to reschedule 6 week PP visit that was scheduled with Dr. Logan Bores on 08/14/2022 at 3:15.  Left  message for pt to call back to reschedule.  Will send a MyChart letter.

## 2022-09-19 IMAGING — US US OB TRANSVAGINAL
1 series · 15 of 28 positions shown · non-contrast
Comparison: None.

CLINICAL DATA: Vaginal bleeding, pain

EXAM:
TRANSVAGINAL OB ULTRASOUND
TECHNIQUE: Transvaginal ultrasound was performed for complete evaluation of the
gestation as well as the maternal uterus, adnexal regions, and
pelvic cul-de-sac.

[Series 1: us ob transvaginal · 15 of 31 slices shown]
[im 1/31]
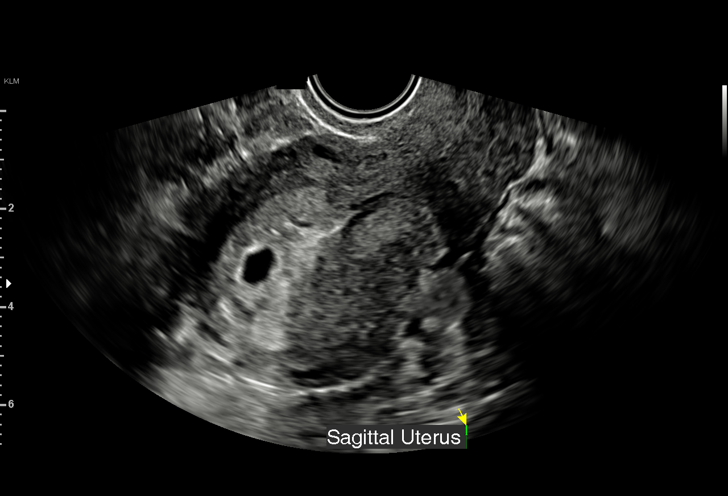
[im 3/31]
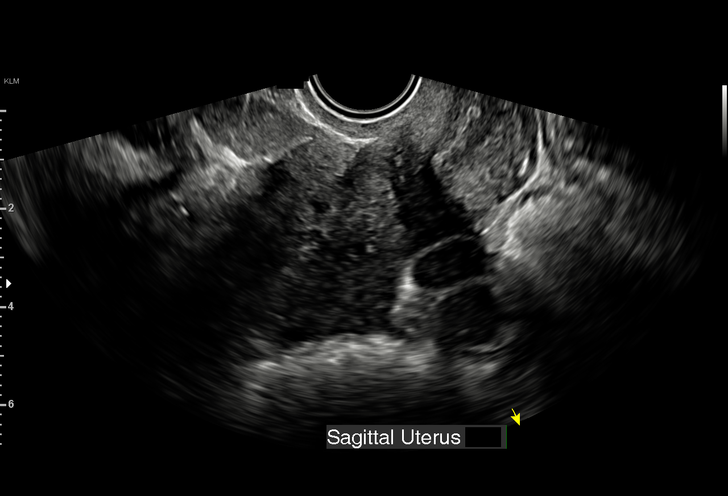
[im 5/31]
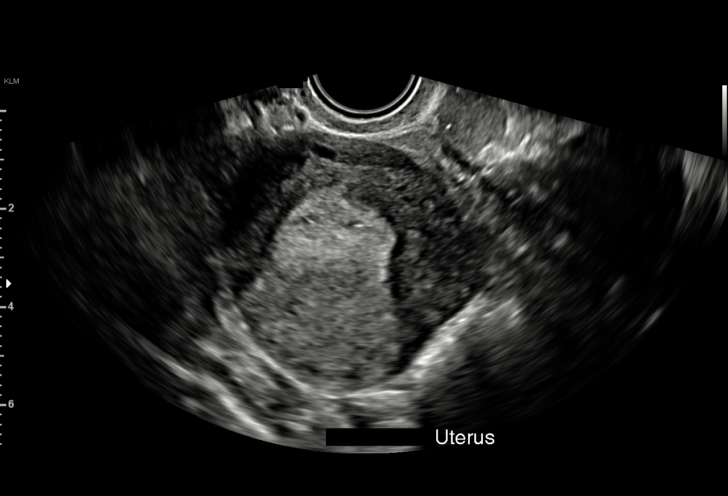
[im 7/31]
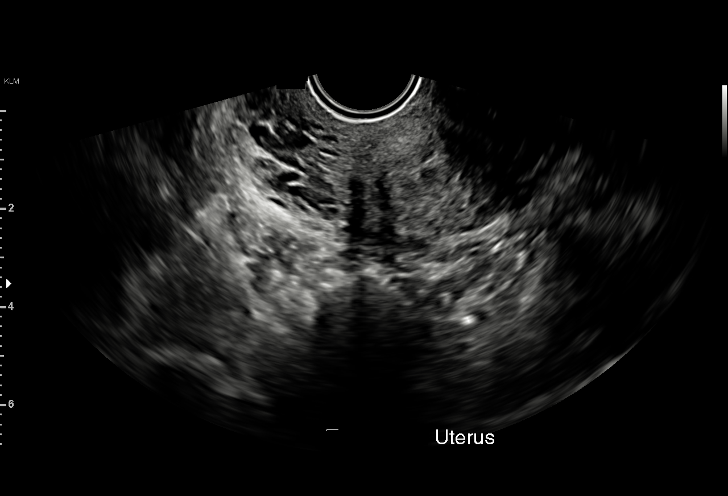
[im 9/31]
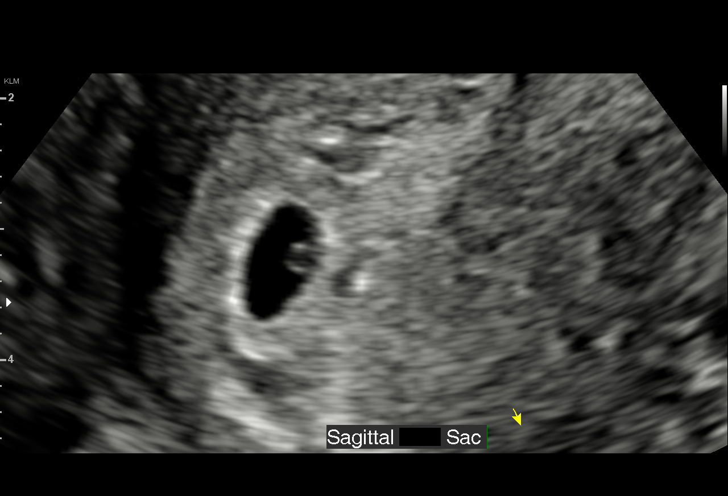
[im 12/31]
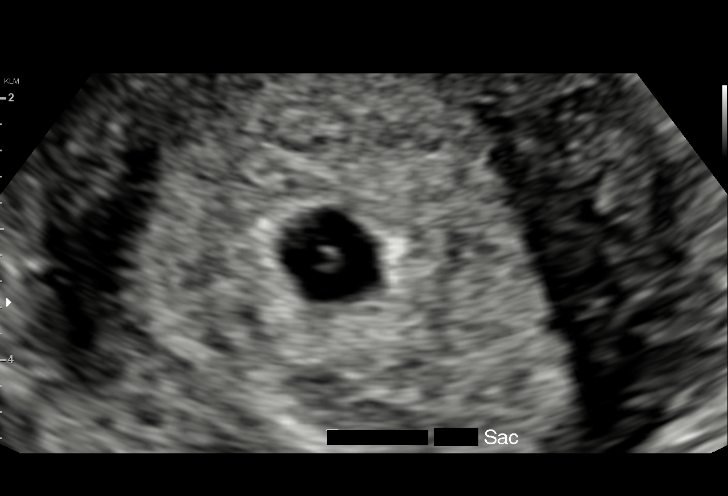
[im 14/31]
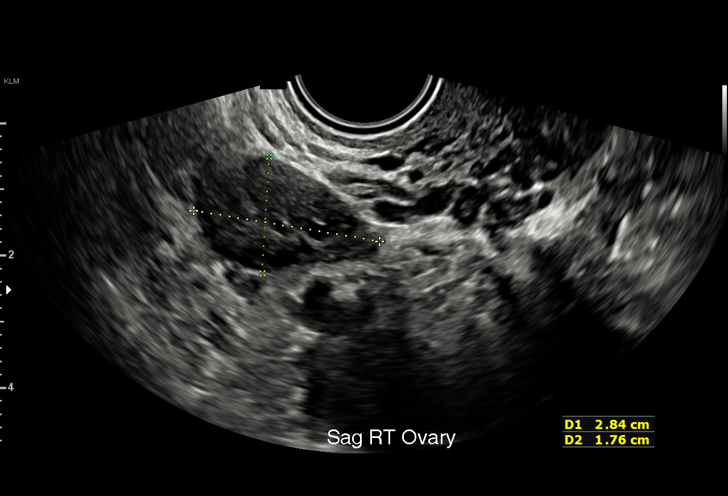
[im 16/31]
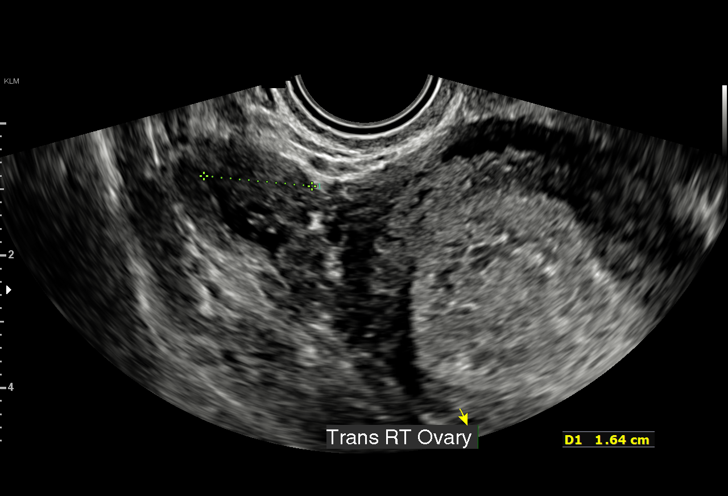
[im 17/31]
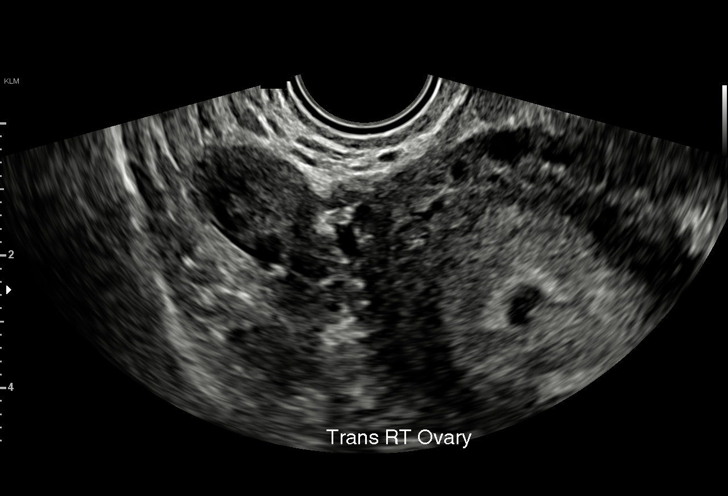
[im 19/31]
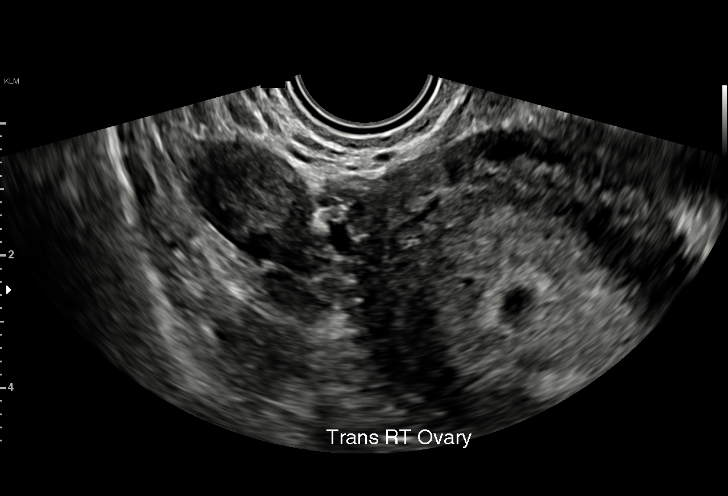
[im 22/31]
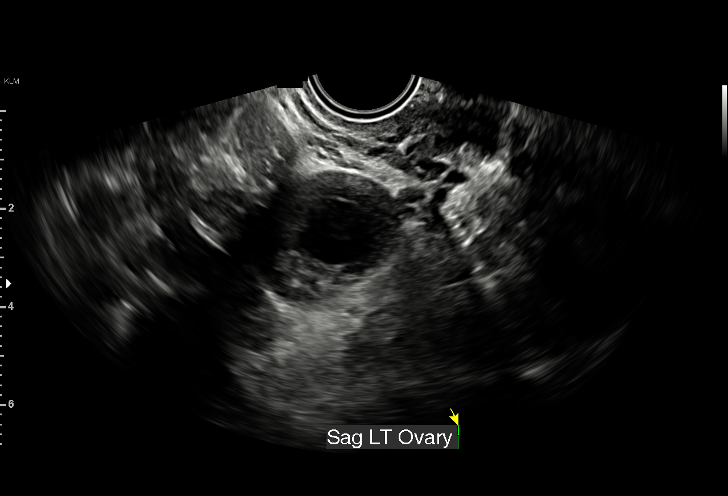
[im 24/31]
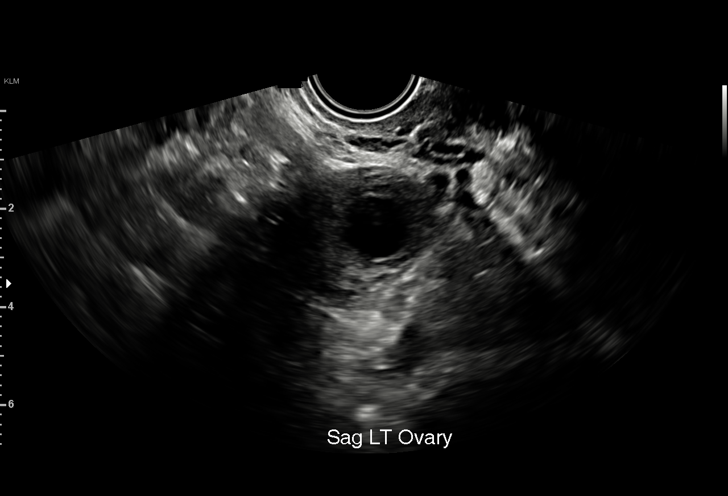
[im 26/31]
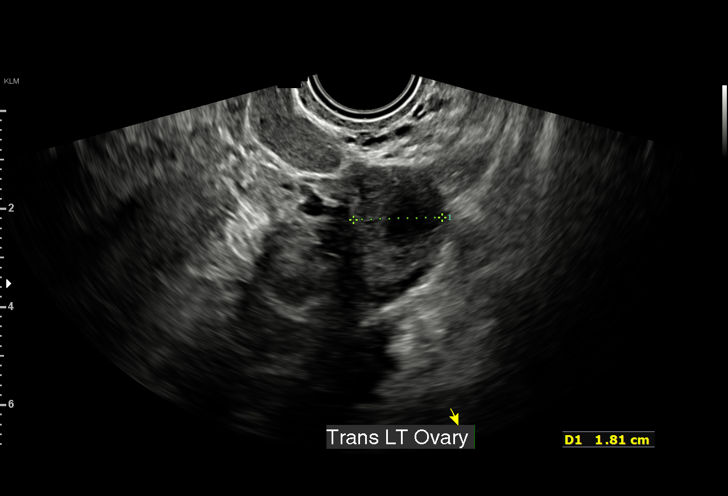
[im 28/31]
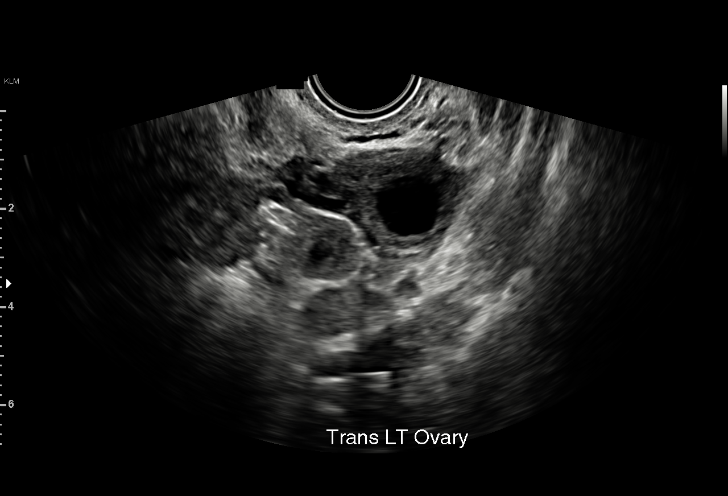
[im 31/31]
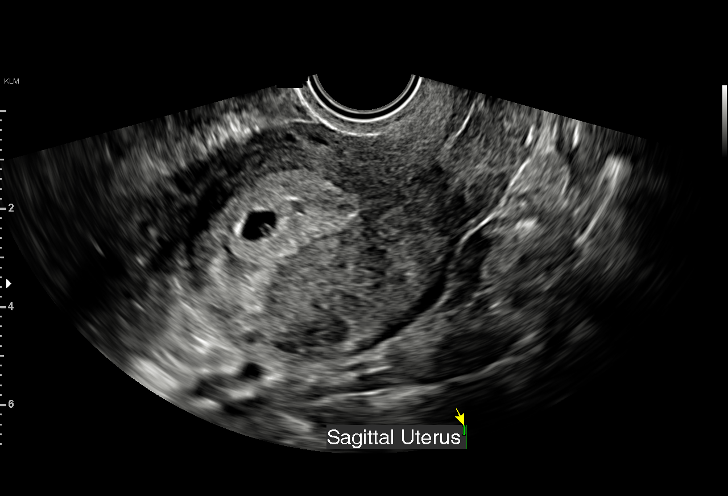

[15 of 28 positions shown; findings below may reference images not displayed]

FINDINGS: Intrauterine gestational sac: Single

Yolk sac:  Seen

Embryo:  Not seen

Cardiac Activity: Not seen

MSD: 7.4 mm

Subchorionic hemorrhage:  None visualized.

Maternal uterus/adnexae: Trace amount of free fluid is seen in the
cul-de-sac. Uterine cervix is closed. There are no dominant adnexal
masses. There is 12 mm simple appearing cyst in the left adnexa,
possibly a follicle.
IMPRESSION: There is a gestational sac containing yolk sac within the fundus of
the uterus. Findings suggest possible early IUP or failed gestation
with incomplete abortion. Serial HCG estimations and short-term
follow-up sonogram as warranted should be considered.

Trace amount of free fluid in the cul-de-sac may be due to recent
rupture of ovarian follicle.

## 2022-09-19 IMAGING — US US RENAL
1 series · 15 of 25 positions shown · non-contrast
Comparison: None.

CLINICAL DATA: Dysuria abdominal pain, back pain

EXAM:
RENAL / URINARY TRACT ULTRASOUND COMPLETE

[Series 1: us renal · 15 of 29 slices shown]
[im 1/29]
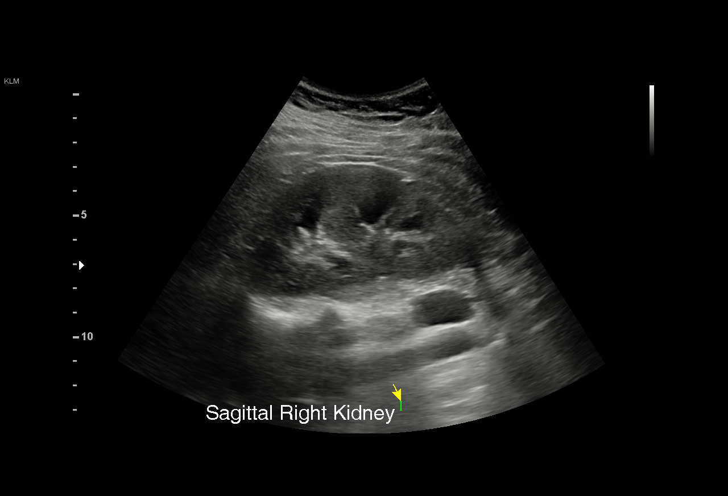
[im 3/29]
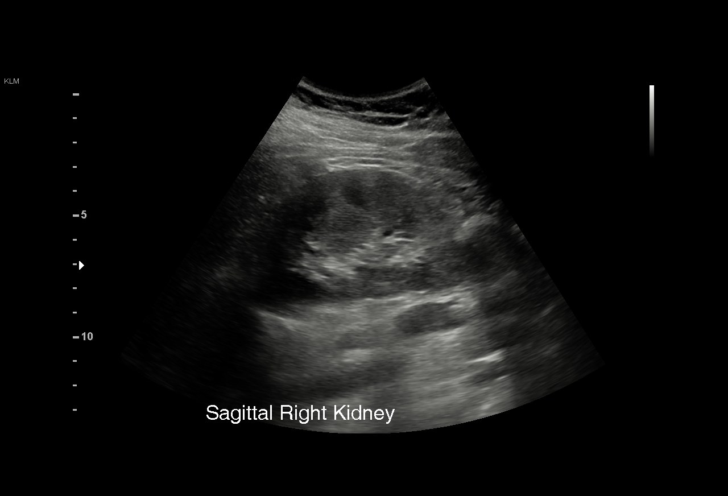
[im 5/29]
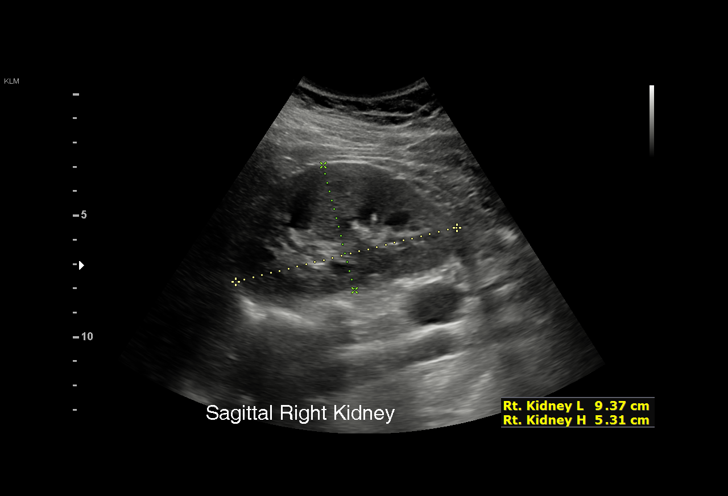
[im 6/29]
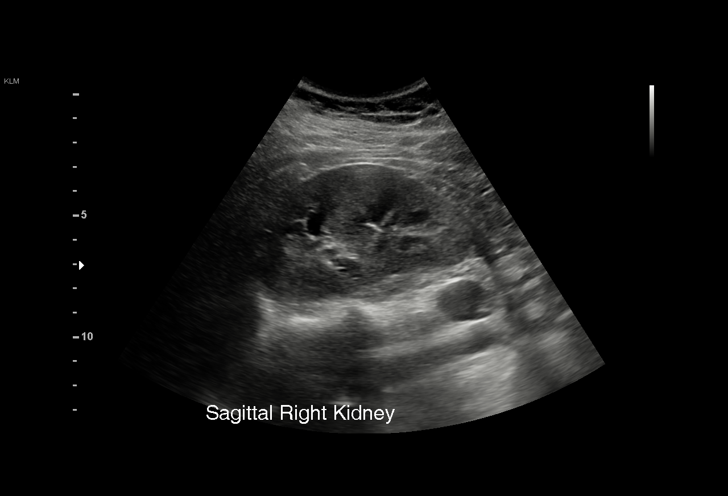
[im 9/29]
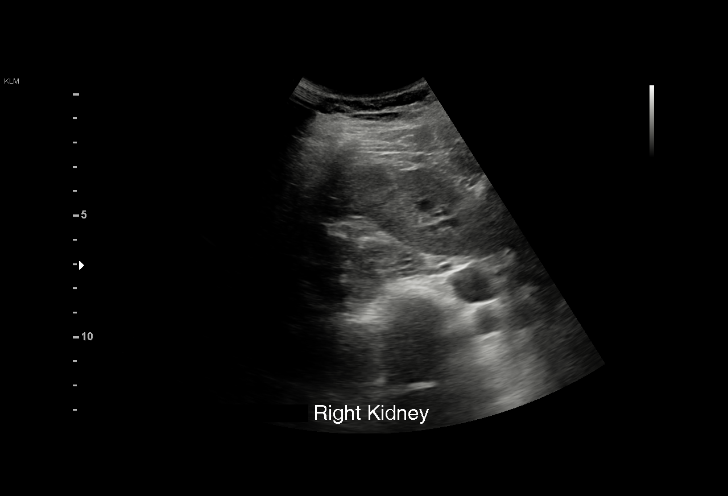
[im 11/29]
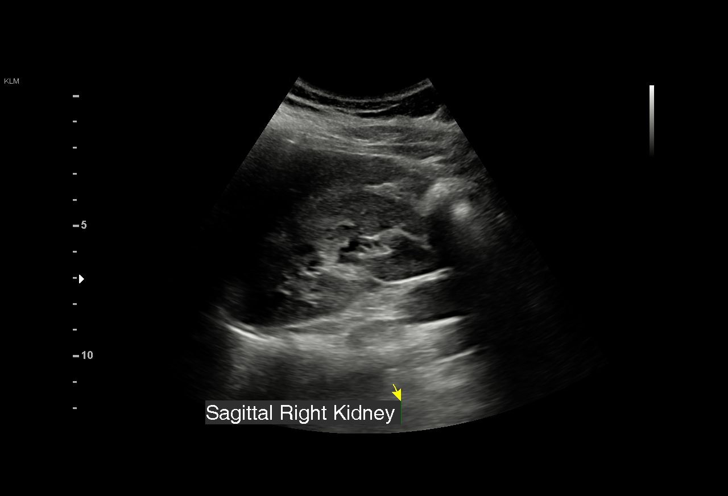
[im 12/29]
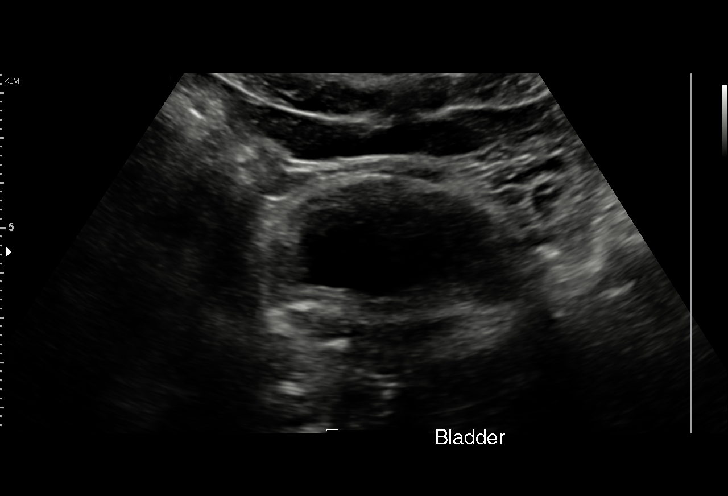
[im 15/29]
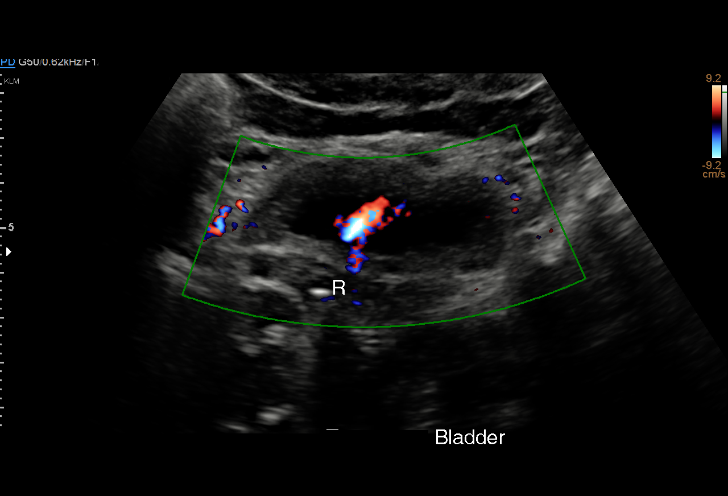
[im 17/29]
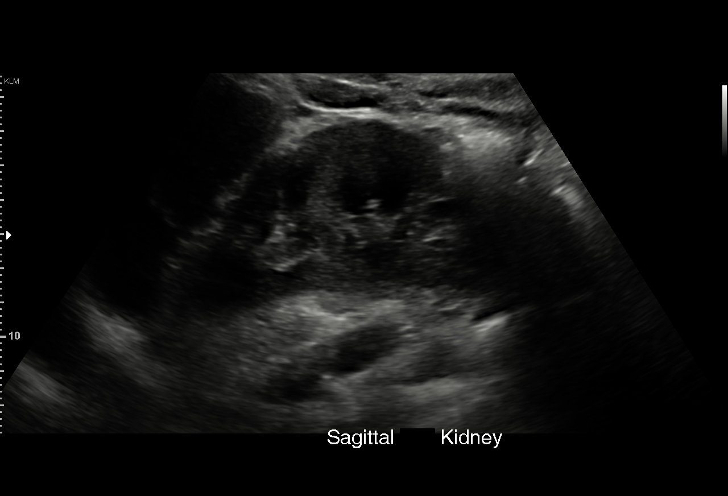
[im 18/29]
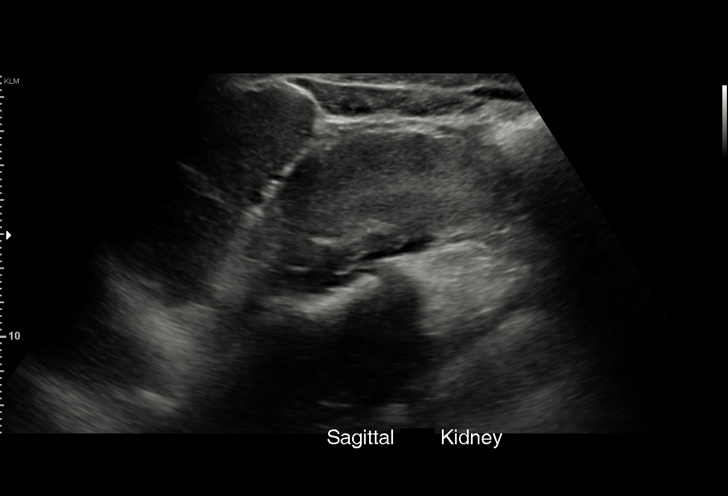
[im 20/29]
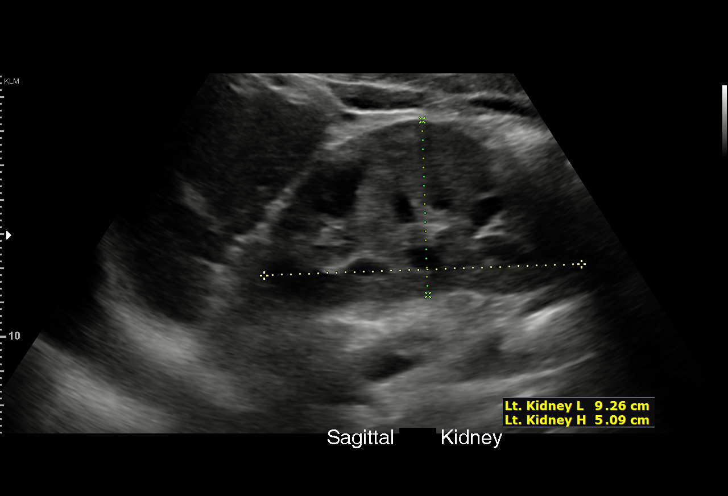
[im 23/29]
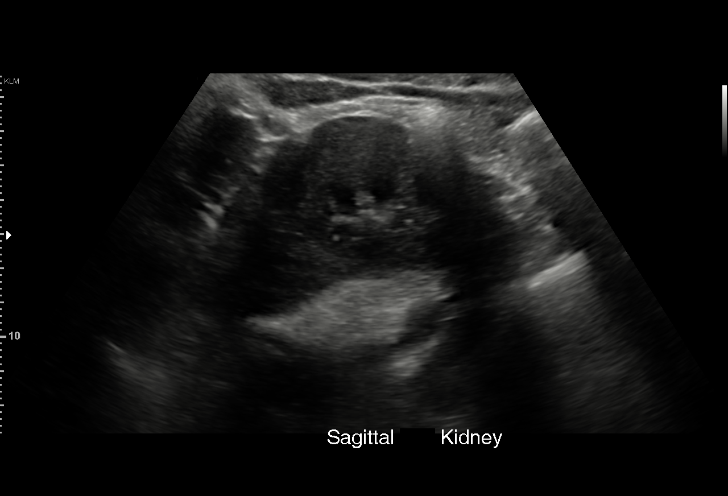
[im 24/29]
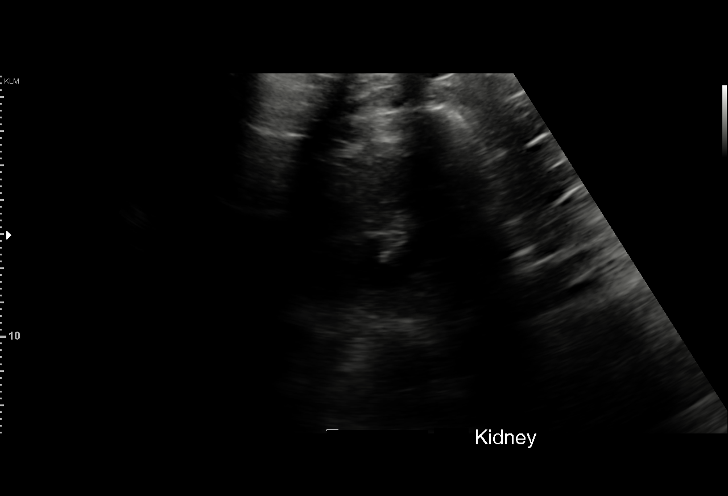
[im 26/29]
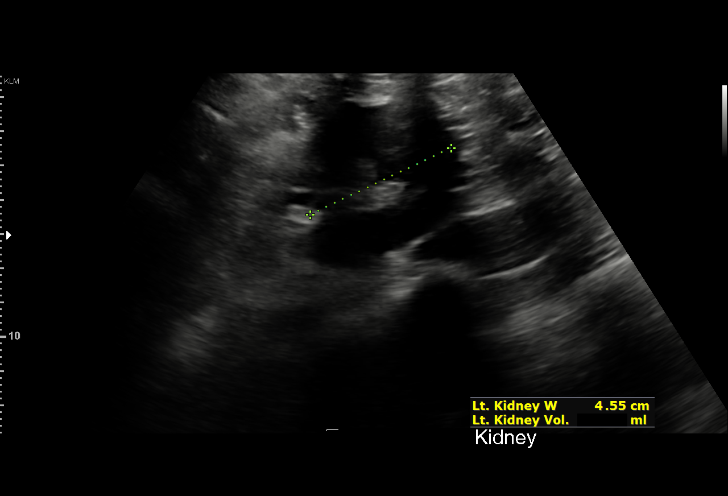
[im 29/29]
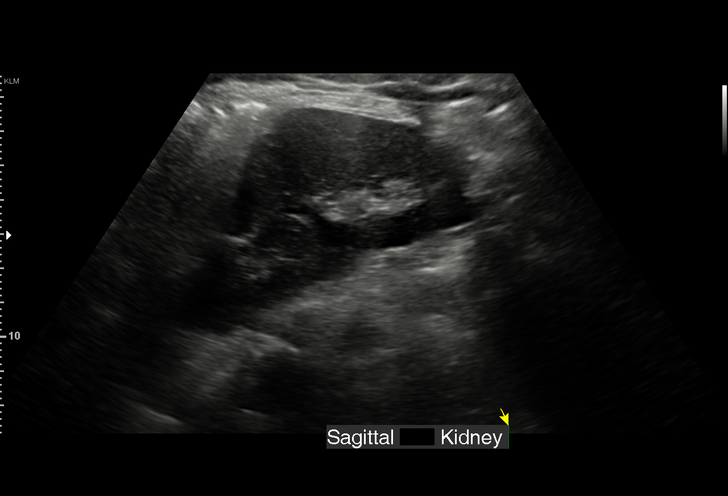

[15 of 25 positions shown; findings below may reference images not displayed]

FINDINGS: Right Kidney:

Renal measurements: 9.4 x 5.3 x 5.5 cm = volume: 144 mL.
Echogenicity within normal limits. No mass or hydronephrosis
visualized.

Left Kidney:

Renal measurements: 9.3 x 5.1 x 4.6 cm = volume: 112.3 mL.
Echogenicity within normal limits. No mass or hydronephrosis
visualized.

Bladder:

Appears normal for degree of bladder distention. Ureteral jets are
observed at both ureterovesical junctions.

Other:

None.
IMPRESSION: No sonographic abnormality is seen in the kidneys.

## 2022-10-03 MED FILL — Iron Sucrose Inj 20 MG/ML (Fe Equiv): INTRAVENOUS | Qty: 10 | Status: AC

## 2022-10-04 ENCOUNTER — Inpatient Hospital Stay: Payer: Medicaid Other | Admitting: Oncology

## 2022-10-04 ENCOUNTER — Inpatient Hospital Stay: Payer: Medicaid Other

## 2022-10-04 ENCOUNTER — Inpatient Hospital Stay: Payer: Medicaid Other | Attending: Oncology

## 2022-12-21 IMAGING — US US OB COMP +14 WK
1 series · 13 of 28 positions shown · non-contrast
Comparison: none

CLINICAL DATA: Fetal anatomy evaluation

EXAM:
OBSTETRICAL ULTRASOUND >14 WKS

[Series 1: us ob comp +14 wk · 0.22mm/px · 13 of 87 slices shown]
[im 4/87]
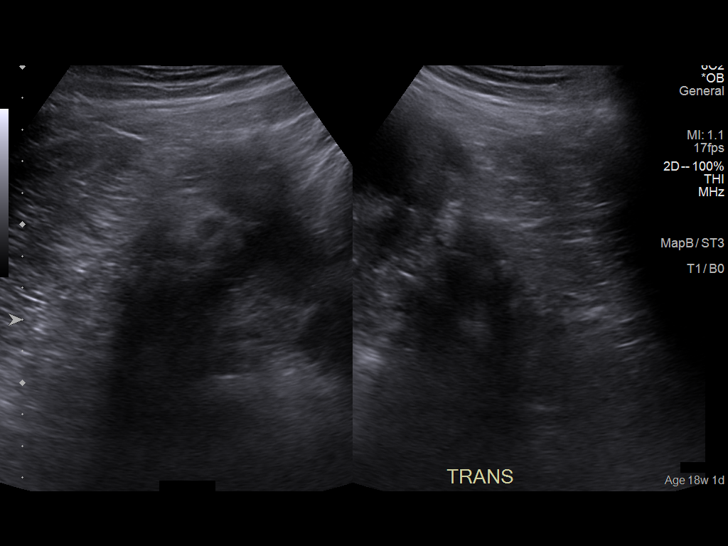
[im 10/87]
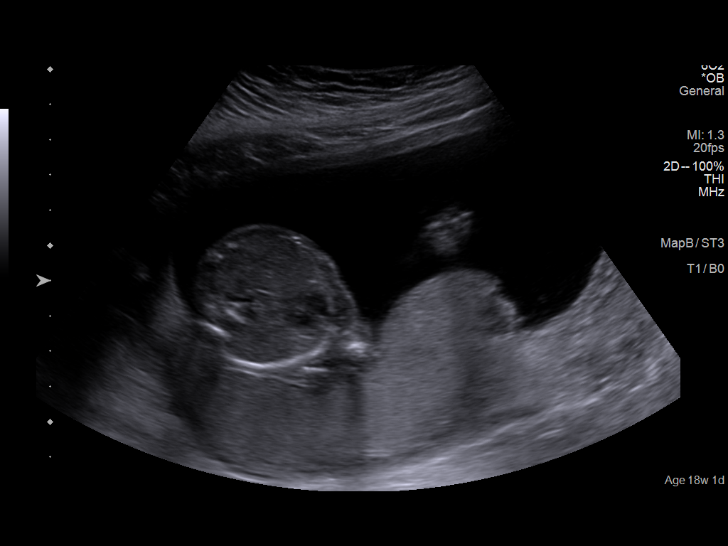
[im 16/87]
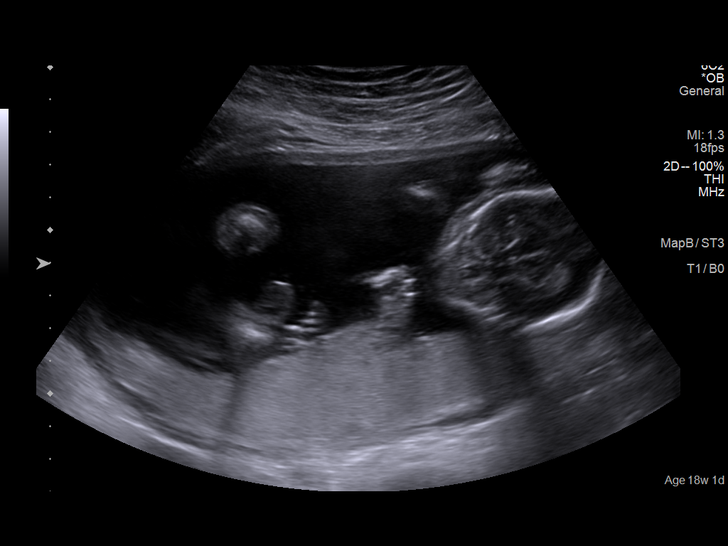
[im 23/87]
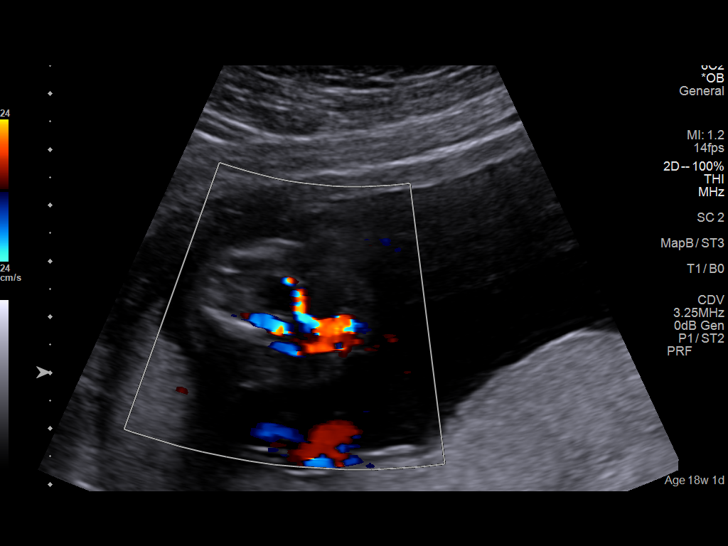
[im 29/87]
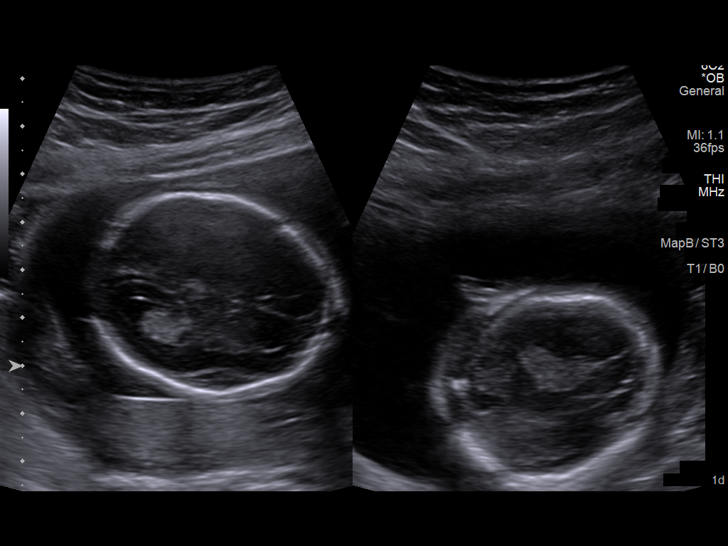
[im 36/87]
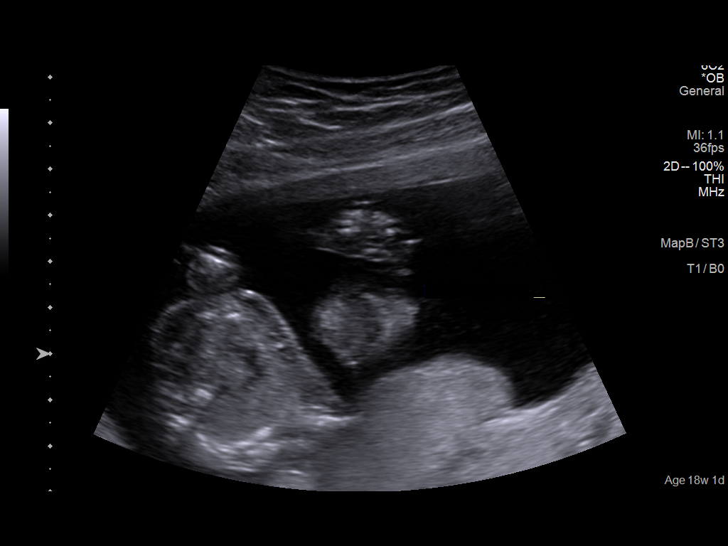
[im 45/87]
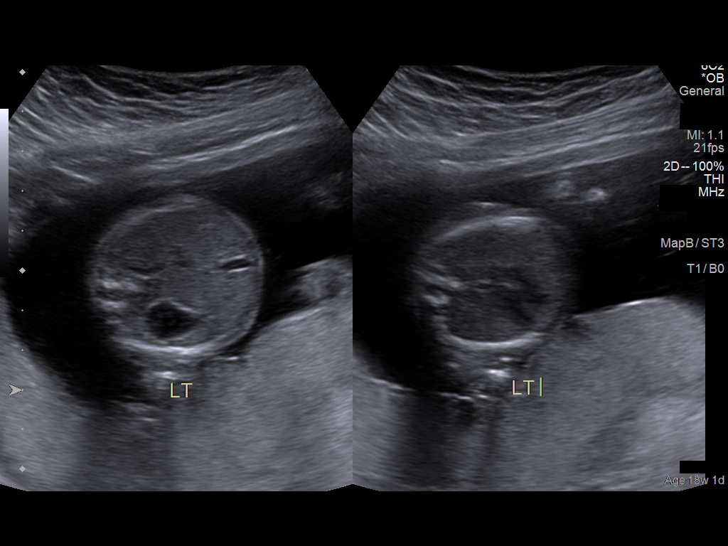
[im 51/87]
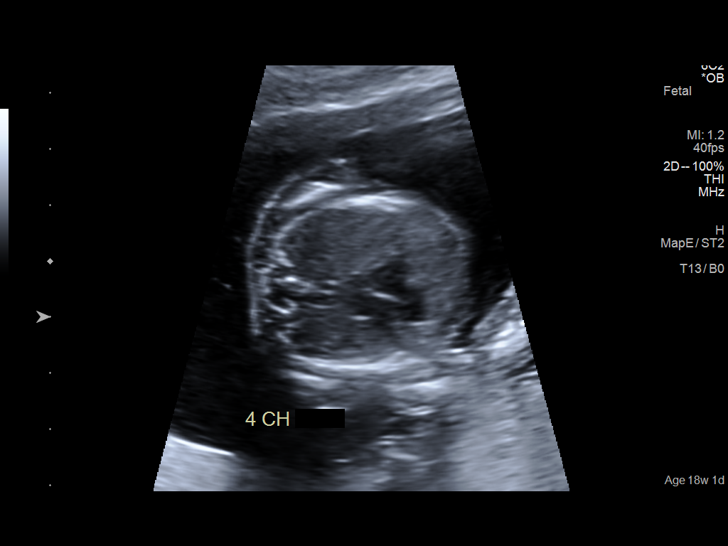
[im 58/87]
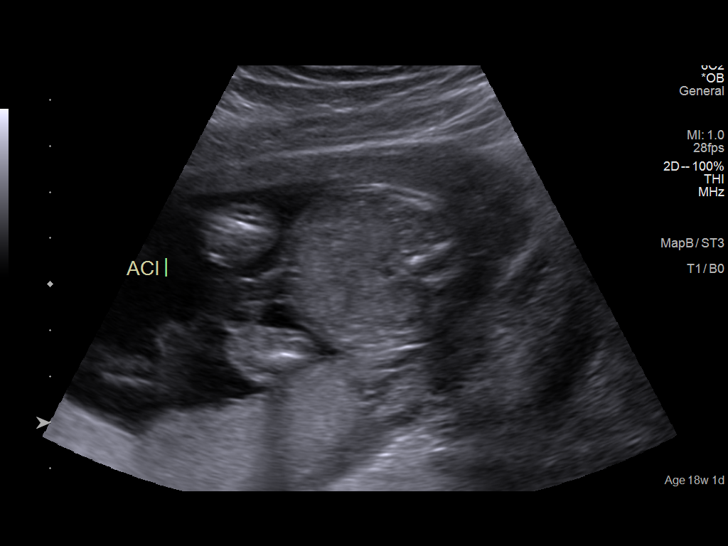
[im 64/87]
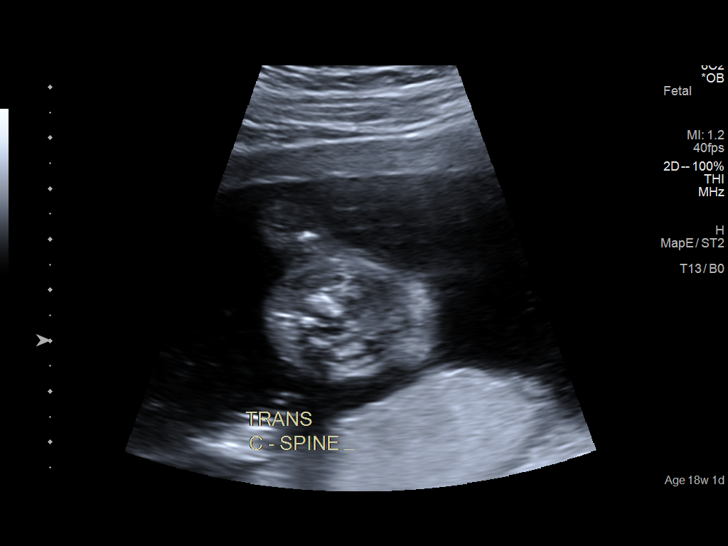
[im 71/87]
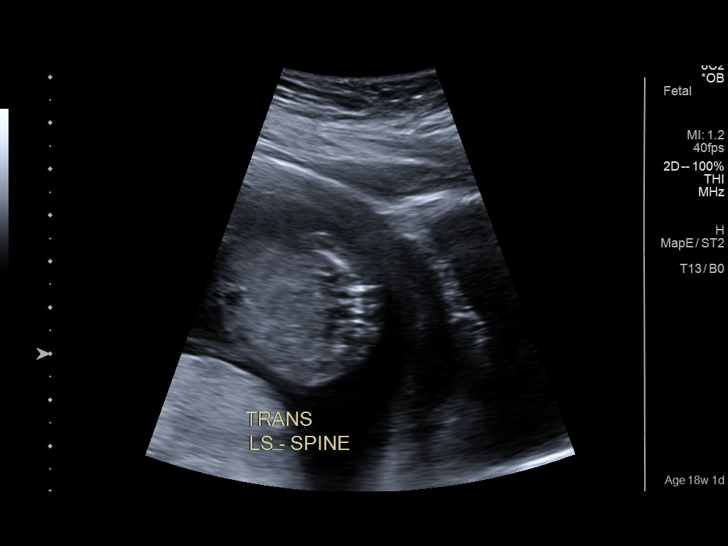
[im 77/87]
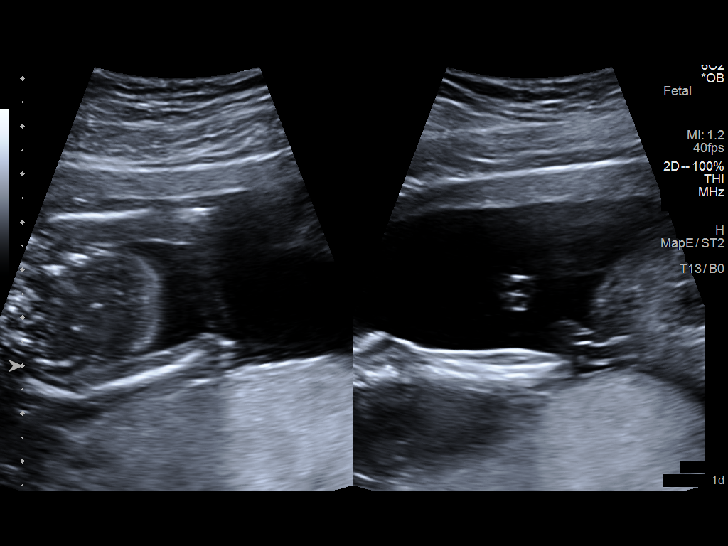
[im 83/87]
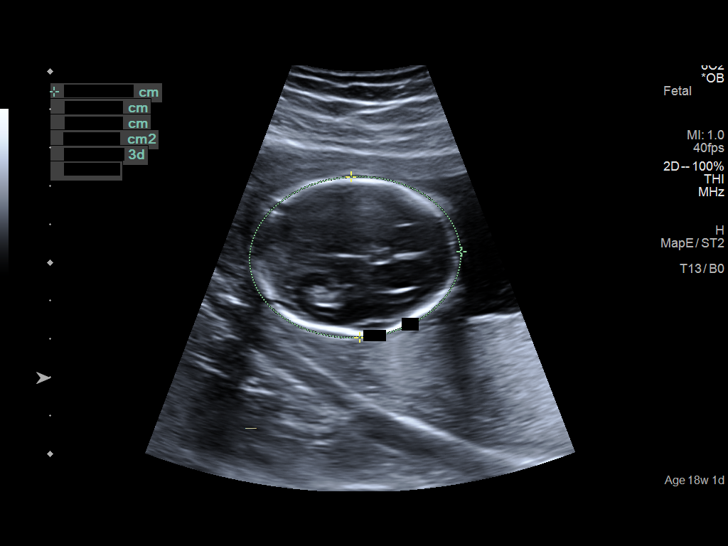

[13 of 28 positions shown; findings below may reference images not displayed]

FINDINGS: Number of Fetuses: 1

Heart Rate:  144 bpm

Movement: Yes

Presentation: Transverse

Previa: No complete placenta previa. Marginal placenta 7 mm from the
internal os.

Placental Location: Posterior

Amniotic Fluid (Subjective): Normal

Amniotic Fluid (Objective):

Vertical pocket = 7.6cm

FETAL BIOMETRY

BPD: 4cm 18w 2d

HC:   15.4cm 18w 3d

AC:   14cm 19w 2d

FL:   3cm 19w 1d

Current Mean GA: 18w 6d US EDC: 06/23/2022

Assigned GA:  18w 1d Assigned EDC: 06/28/2022

FETAL ANATOMY

Lateral Ventricles: Appears normal

Thalami/CSP: Appears normal

Posterior Fossa:  Appears normal

Nuchal Region: Appears normal   NFT= 3 mm

Upper Lip: Appears normal

Spine: Appears normal

4 Chamber Heart on Left: Appears normal

LVOT: Appears normal

RVOT: Appears normal

Stomach on Left: Appears normal

3 Vessel Cord: Appears normal

Cord Insertion site: Appears normal

Kidneys: Appears normal

Bladder: Appears normal

Extremities: Appears normal

Technically difficult due to: None

Maternal Findings:

Cervix: 7 cm. Closed.
IMPRESSION: 1. Single live intrauterine pregnancy as detailed above.
2. No complete placenta previa. Marginal placenta previa 7 mm from
the internal os.
3. No focal fetal anatomic abnormality.

## 2024-02-19 DIAGNOSIS — L700 Acne vulgaris: Secondary | ICD-10-CM | POA: Diagnosis not present

## 2024-06-05 DIAGNOSIS — L700 Acne vulgaris: Secondary | ICD-10-CM | POA: Diagnosis not present

## 2024-06-05 DIAGNOSIS — L810 Postinflammatory hyperpigmentation: Secondary | ICD-10-CM | POA: Diagnosis not present
